# Patient Record
Sex: Female | Born: 1950 | Race: Black or African American | Hispanic: No | Marital: Single | Smoking: Never smoker
Health system: Southern US, Community
[De-identification: ages and names within clinical notes are randomized; demographics above are authoritative.]

## PROBLEM LIST (undated history)

## (undated) DIAGNOSIS — M549 Dorsalgia, unspecified: Secondary | ICD-10-CM

## (undated) DIAGNOSIS — M359 Systemic involvement of connective tissue, unspecified: Secondary | ICD-10-CM

## (undated) DIAGNOSIS — D649 Anemia, unspecified: Secondary | ICD-10-CM

## (undated) DIAGNOSIS — R51 Headache: Secondary | ICD-10-CM

## (undated) DIAGNOSIS — K829 Disease of gallbladder, unspecified: Secondary | ICD-10-CM

## (undated) DIAGNOSIS — M2550 Pain in unspecified joint: Secondary | ICD-10-CM

## (undated) DIAGNOSIS — E669 Obesity, unspecified: Secondary | ICD-10-CM

## (undated) DIAGNOSIS — E559 Vitamin D deficiency, unspecified: Secondary | ICD-10-CM

## (undated) DIAGNOSIS — F329 Major depressive disorder, single episode, unspecified: Secondary | ICD-10-CM

## (undated) DIAGNOSIS — M3500 Sicca syndrome, unspecified: Secondary | ICD-10-CM

## (undated) DIAGNOSIS — G4733 Obstructive sleep apnea (adult) (pediatric): Secondary | ICD-10-CM

## (undated) DIAGNOSIS — R9431 Abnormal electrocardiogram [ECG] [EKG]: Secondary | ICD-10-CM

## (undated) DIAGNOSIS — M1990 Unspecified osteoarthritis, unspecified site: Secondary | ICD-10-CM

## (undated) DIAGNOSIS — R519 Headache, unspecified: Secondary | ICD-10-CM

## (undated) DIAGNOSIS — D8989 Other specified disorders involving the immune mechanism, not elsewhere classified: Secondary | ICD-10-CM

## (undated) DIAGNOSIS — IMO0001 Reserved for inherently not codable concepts without codable children: Secondary | ICD-10-CM

## (undated) DIAGNOSIS — I10 Essential (primary) hypertension: Secondary | ICD-10-CM

## (undated) DIAGNOSIS — F32A Depression, unspecified: Secondary | ICD-10-CM

## (undated) HISTORY — DX: Pain in unspecified joint: M25.50

## (undated) HISTORY — DX: Anemia, unspecified: D64.9

## (undated) HISTORY — DX: Major depressive disorder, single episode, unspecified: F32.9

## (undated) HISTORY — DX: Depression, unspecified: F32.A

## (undated) HISTORY — DX: Dorsalgia, unspecified: M54.9

## (undated) HISTORY — PX: ABDOMINAL HYSTERECTOMY: SHX81

## (undated) HISTORY — DX: Obstructive sleep apnea (adult) (pediatric): G47.33

## (undated) HISTORY — DX: Unspecified osteoarthritis, unspecified site: M19.90

## (undated) HISTORY — DX: Vitamin D deficiency, unspecified: E55.9

## (undated) HISTORY — DX: Sicca syndrome, unspecified: M35.00

## (undated) HISTORY — DX: Systemic involvement of connective tissue, unspecified: M35.9

## (undated) HISTORY — PX: CHOLECYSTECTOMY: SHX55

## (undated) HISTORY — DX: Disease of gallbladder, unspecified: K82.9

## (undated) HISTORY — PX: OTHER SURGICAL HISTORY: SHX169

## (undated) HISTORY — DX: Essential (primary) hypertension: I10

## (undated) HISTORY — DX: Obesity, unspecified: E66.9

## (undated) HISTORY — DX: Abnormal electrocardiogram (ECG) (EKG): R94.31

---

## 1998-02-06 ENCOUNTER — Encounter: Admission: RE | Admit: 1998-02-06 | Discharge: 1998-05-07 | Payer: Self-pay | Admitting: Family Medicine

## 2000-02-02 ENCOUNTER — Encounter: Admission: RE | Admit: 2000-02-02 | Discharge: 2000-02-02 | Payer: Self-pay | Admitting: *Deleted

## 2000-02-02 ENCOUNTER — Encounter: Payer: Self-pay | Admitting: *Deleted

## 2001-08-16 ENCOUNTER — Encounter: Payer: Self-pay | Admitting: Family Medicine

## 2001-08-16 ENCOUNTER — Encounter: Admission: RE | Admit: 2001-08-16 | Discharge: 2001-08-16 | Payer: Self-pay | Admitting: Family Medicine

## 2002-05-07 ENCOUNTER — Encounter: Payer: Self-pay | Admitting: Family Medicine

## 2002-05-07 ENCOUNTER — Encounter: Admission: RE | Admit: 2002-05-07 | Discharge: 2002-05-07 | Payer: Self-pay | Admitting: Family Medicine

## 2004-12-28 ENCOUNTER — Encounter: Admission: RE | Admit: 2004-12-28 | Discharge: 2005-03-28 | Payer: Self-pay | Admitting: Family Medicine

## 2006-03-23 ENCOUNTER — Encounter: Admission: RE | Admit: 2006-03-23 | Discharge: 2006-03-23 | Payer: Self-pay | Admitting: Family Medicine

## 2006-06-27 ENCOUNTER — Emergency Department (HOSPITAL_COMMUNITY): Admission: EM | Admit: 2006-06-27 | Discharge: 2006-06-27 | Payer: Self-pay | Admitting: Emergency Medicine

## 2008-08-19 ENCOUNTER — Emergency Department (HOSPITAL_COMMUNITY): Admission: EM | Admit: 2008-08-19 | Discharge: 2008-08-20 | Payer: Self-pay | Admitting: Emergency Medicine

## 2010-02-19 ENCOUNTER — Ambulatory Visit (HOSPITAL_COMMUNITY)
Admission: RE | Admit: 2010-02-19 | Discharge: 2010-02-19 | Payer: Self-pay | Source: Home / Self Care | Admitting: Family Medicine

## 2010-06-28 LAB — URINALYSIS, ROUTINE W REFLEX MICROSCOPIC
Glucose, UA: NEGATIVE mg/dL
Ketones, ur: NEGATIVE mg/dL
Protein, ur: NEGATIVE mg/dL
Urobilinogen, UA: 1 mg/dL (ref 0.0–1.0)

## 2010-06-28 LAB — POCT CARDIAC MARKERS
CKMB, poc: 1.4 ng/mL (ref 1.0–8.0)
Myoglobin, poc: 139 ng/mL (ref 12–200)
Troponin i, poc: <0.05 ng/mL (ref 0.00–0.09)

## 2010-06-28 LAB — CBC
Platelets: 211 10*3/uL (ref 150–400)
RBC: 4.57 MIL/uL (ref 3.87–5.11)
WBC: 9.4 10*3/uL (ref 4.0–10.5)

## 2010-06-28 LAB — POCT I-STAT 3, VENOUS BLOOD GAS (G3P V)
Patient temperature: 98.1
pH, Ven: 7.446 — ABNORMAL HIGH (ref 7.250–7.300)

## 2010-06-28 LAB — DIFFERENTIAL
Lymphocytes Relative: 16 % (ref 12–46)
Lymphs Abs: 1.5 10*3/uL (ref 0.7–4.0)
Neutrophils Relative %: 73 % (ref 43–77)

## 2010-06-28 LAB — BASIC METABOLIC PANEL
BUN: 11 mg/dL (ref 6–23)
Creatinine, Ser: 0.83 mg/dL (ref 0.4–1.2)
GFR calc Af Amer: >60 mL/min (ref >60)
GFR calc non Af Amer: >60 mL/min (ref >60)
Potassium: 3.8 mEq/L (ref 3.5–5.1)

## 2010-06-28 LAB — BRAIN NATRIURETIC PEPTIDE: Pro B Natriuretic peptide (BNP): <30 pg/mL (ref 0.0–100.0)

## 2011-01-07 ENCOUNTER — Ambulatory Visit
Admission: RE | Admit: 2011-01-07 | Discharge: 2011-01-07 | Disposition: A | Discharge status: Home / Self Care | Payer: Federal, State, Local not specified - PPO | Source: Ambulatory Visit | Attending: Family Medicine | Admitting: Family Medicine

## 2011-01-07 ENCOUNTER — Other Ambulatory Visit: Payer: Self-pay | Admitting: Family Medicine

## 2011-03-22 DIAGNOSIS — M3500 Sicca syndrome, unspecified: Secondary | ICD-10-CM

## 2011-03-22 HISTORY — DX: Sjogren syndrome, unspecified: M35.00

## 2011-05-27 ENCOUNTER — Ambulatory Visit (INDEPENDENT_AMBULATORY_CARE_PROVIDER_SITE_OTHER): Payer: Federal, State, Local not specified - PPO | Admitting: Family Medicine

## 2011-05-27 VITALS — BP 204/76 | HR 50 | Temp 98.8°F | Resp 18 | Ht 63.5 in | Wt 259.0 lb

## 2011-05-27 DIAGNOSIS — I10 Essential (primary) hypertension: Secondary | ICD-10-CM

## 2011-05-27 MED ORDER — OLMESARTAN MEDOXOMIL 20 MG PO TABS: 20.0000 mg | ORAL_TABLET | Freq: Every day | ORAL | Status: DC

## 2011-05-27 NOTE — Progress Notes (Signed)
61 yo woman here for hypertension review.  Metoprolol seems to have caused visual problems, hair loss, intermittent chest pressure, slow heart rate, cold feelings, and headaches.  Also, the systolic BP is often elevated, although the diastolic is 70 to 80.    O:  NAD Pulse 50  BP 170/90 Heart:  Reg, no murmur Ext: no edema  A:  Adverse effects from metoprolol  P:  Taper metoprolol by taking 1/2 tab daily for 2 weeks, then discontinuing Benicar 20 mg daily

## 2011-05-27 NOTE — Patient Instructions (Signed)

## 2011-07-27 ENCOUNTER — Ambulatory Visit (INDEPENDENT_AMBULATORY_CARE_PROVIDER_SITE_OTHER): Payer: Federal, State, Local not specified - PPO | Admitting: Family Medicine

## 2011-07-27 ENCOUNTER — Encounter: Payer: Self-pay | Admitting: Family Medicine

## 2011-07-27 DIAGNOSIS — R079 Chest pain, unspecified: Secondary | ICD-10-CM

## 2011-07-27 DIAGNOSIS — Z0000 Encounter for general adult medical examination without abnormal findings: Secondary | ICD-10-CM

## 2011-07-27 DIAGNOSIS — I10 Essential (primary) hypertension: Secondary | ICD-10-CM

## 2011-07-27 NOTE — Progress Notes (Signed)
Patient is having intermittent vague chest pains x 2 weeks, not related to exercise.  Her blood pressure has been elevated during this time despite making the transition from Metoprolol.  She has a family hx of cardiac disease (father had CABG in his late 57's)  O:  NAD, obese woman HEENT:  Unremarkable Chest:  Clear Heart:  Reg without murmur or gallop Ext:  No edema EKG:  ST changes c/w ischemia or strain  A:  I am concerned with the EKG changes.  The blood pressure is also not where we want it  P:  Schedule cardiology appt ASAP and add Norvasc 5 mg qd to the Benicar 20 mg. Recheck in a month

## 2011-08-03 ENCOUNTER — Other Ambulatory Visit: Payer: Self-pay | Admitting: Family Medicine

## 2011-08-03 DIAGNOSIS — Z1231 Encounter for screening mammogram for malignant neoplasm of breast: Secondary | ICD-10-CM

## 2011-08-18 ENCOUNTER — Encounter: Payer: Self-pay | Admitting: Family Medicine

## 2011-08-18 ENCOUNTER — Ambulatory Visit (INDEPENDENT_AMBULATORY_CARE_PROVIDER_SITE_OTHER): Payer: Federal, State, Local not specified - PPO | Admitting: Family Medicine

## 2011-08-18 VITALS — BP 145/80 | HR 62 | Temp 98.4°F | Resp 16 | Ht 63.5 in | Wt 270.8 lb

## 2011-08-18 DIAGNOSIS — I10 Essential (primary) hypertension: Secondary | ICD-10-CM

## 2011-08-18 MED ORDER — AMLODIPINE BESYLATE 10 MG PO TABS: 10.0000 mg | ORAL_TABLET | Freq: Every day | ORAL | Status: DC

## 2011-08-18 MED ORDER — OLMESARTAN MEDOXOMIL 20 MG PO TABS: 20.0000 mg | ORAL_TABLET | Freq: Every day | ORAL | Status: DC

## 2011-08-18 NOTE — Progress Notes (Signed)
61 yo woman with hypertension and intermittent chest pain.  She has seen Dr. Jacinto Halim who has prescribed a higher dose of amlodipine and omeprazole and will see her for GXT and echo in next 10 days.  She has sleep apnea appointment next week.  No significant chest pains lately, so this chest pain does seem to be improving. Has colonoscopy scheduled for June 4, mammogram also scheduled for June.  Patient is getting 115/70 now at home.  O: NAD, obese woman Chest:  Clear Heart:  I/VI SEM, regular Ext:  No edema Neck supple, 1/2 cm left posterior cervical sub q nodule, nontender, nonfluctuant  A: stable htn  P: okay to refill meds  Follow up 4 months

## 2011-08-18 NOTE — Patient Instructions (Signed)

## 2011-08-30 ENCOUNTER — Ambulatory Visit
Admission: RE | Admit: 2011-08-30 | Discharge: 2011-08-30 | Disposition: A | Discharge status: Home / Self Care | Payer: Federal, State, Local not specified - PPO | Source: Ambulatory Visit | Attending: Family Medicine | Admitting: Family Medicine

## 2011-08-30 DIAGNOSIS — Z1231 Encounter for screening mammogram for malignant neoplasm of breast: Secondary | ICD-10-CM

## 2011-09-27 ENCOUNTER — Other Ambulatory Visit: Payer: Self-pay | Admitting: Family Medicine

## 2011-11-25 ENCOUNTER — Ambulatory Visit: Payer: Federal, State, Local not specified - PPO

## 2011-11-25 ENCOUNTER — Ambulatory Visit (INDEPENDENT_AMBULATORY_CARE_PROVIDER_SITE_OTHER): Payer: Federal, State, Local not specified - PPO | Admitting: Family Medicine

## 2011-11-25 VITALS — BP 150/72 | HR 61 | Temp 97.9°F | Resp 16 | Ht 64.5 in | Wt 275.0 lb

## 2011-11-25 DIAGNOSIS — M79643 Pain in unspecified hand: Secondary | ICD-10-CM

## 2011-11-25 DIAGNOSIS — M79609 Pain in unspecified limb: Secondary | ICD-10-CM

## 2011-11-25 DIAGNOSIS — M1990 Unspecified osteoarthritis, unspecified site: Secondary | ICD-10-CM

## 2011-11-25 DIAGNOSIS — M129 Arthropathy, unspecified: Secondary | ICD-10-CM

## 2011-11-25 MED ORDER — TRAMADOL HCL 50 MG PO TABS: 50.0000 mg | ORAL_TABLET | Freq: Three times a day (TID) | ORAL | Status: AC | PRN

## 2011-11-25 NOTE — Progress Notes (Signed)
[facility] [address] [place] [address] [phone]  Date:  11/25/2011   Name:  Elizabeth [name]   DOB:  [birth year]   MRN:  [id number]  PCP:  No primary provider on file.    Chief Complaint: Hand Pain   History of Present Illness:  Elizabeth [name] is a 61 y.o. very pleasant female patient who presents with the following:  History of HTN and obesity.  Here today to discuss hand pain. 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   She has noted the return of her hand pain.  She hurts in both hands, in the palm and fingers. Not able to specify which fingers bother her, but the 5th fingers seem to be ok. Right hand hurts the most.  She has not noted worse pain with any particular time of day or activity.  She notes tingling in both hands, and that her fingers hurt as well.  She notes increased difficulty holding/ picking up things.  It hurts if someone shakes her hand.  She has not noted any particular morning stiffness.   No other joint pains. She has worn her old wrist braces some, but they have not really helped.   She has not used much motrin because she just had a colonoscopy she was told not to use her ibuprofen around the time of her test.  The test was normal, and it was done last week.   She did not take her HTN medications yet today- she usually takes it first thing in the morning.    No family or personal history of RA or autoimmune disorder that she knows of  Patient Active Problem List  Diagnosis  . Hypertension    No past medical history on file.  No past surgical history on file.  History  Substance Use Topics  . Smoking status: Never Smoker   . Smokeless tobacco: Not on file  . Alcohol Use: Not on file    No family history on file.  No Known Allergies  Medication list has been reviewed and updated.  Current Outpatient Prescriptions on File Prior to Visit  Medication Sig Dispense Refill  .  amLODipine (NORVASC) 10 MG tablet Take 1 tablet (10 mg total) by mouth daily.  90 tablet  3  . olmesartan (BENICAR) 20 MG tablet Take 1 tablet (20 mg total) by mouth daily.  90 tablet  3  . aspirin 81 MG chewable tablet Chew 81 mg by mouth daily.      . fish oil-omega-3 fatty acids 1000 MG capsule Take 2 g by mouth daily.      Marland Kitchen omeprazole (PRILOSEC) 40 MG capsule Take 40 mg by mouth daily.        Review of Systems:  As per HPI- otherwise negative.   Physical Examination: Filed Vitals:   11/25/11 0840  BP: 150/72  Pulse: 61  Temp: 97.9 F (36.6 C)  Resp: 16   Filed Vitals:   11/25/11 0840  Height: 5' 4.5" (1.638 m)  Weight: 275 lb (124.739 kg)   Body mass index is 46.47 kg/(m^2). Ideal Body Weight: Weight in (lb) to have BMI = 25: 147.6   GEN: WDWN, NAD, Non-toxic, A & O x 3, obese HEENT: Atraumatic, Normocephalic. Neck supple. No masses, No LAD.   Ears and Nose: No external deformity. CV: RRR, No M/G/R. No JVD. No thrill. No extra heart sounds. PULM: CTA B, no wheezes,  crackles, rhonchi. No retractions. No resp. distress. No accessory muscle use. EXTR: No c/c/e NEURO Normal gait.  PSYCH: Normally interactive. Conversant. Not depressed or anxious appearing.  Calm demeanor.  Hands: no swelling or point tenderness. No nodules or ulnar deviation. Some tingling with percussion over the median nerve right hand.  She has 4/5 strength on grip testing.  Normal wrist flexion and extension strength.    [facility] reading (PRIMARY) by  Dr. Patsy Lager.  Hands: minor IP joint changes, no erosions or fracture/ dislocation  LEFT HAND - 2 VIEW  Comparison: None.  Findings: There are slight degenerative changes of the first metacarpal phalangeal joint between the scaphoid and the trapezium and trapezoid. Degenerative arthritis of the first metacarpal phalangeal joint. The other metacarpal phalangeal joints are normal. Minimal spurring at the DIP joints of the fingers.  No periarticular  demineralization or subluxation.  IMPRESSION: Scattered areas of slight osteoarthritis.  RIGHT HAND - 2 VIEW  Comparison: None.  Findings: There is slight arthritic changes of the first metacarpal phalangeal joint and of the first carpal metacarpal joint. There are slight osteophytes at the second third fourth metacarpal phalangeal joints as well as a small erosions at the DIP joints of all of the fingers.  No periarticular demineralization or subluxation.  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  Assessment and Plan: 1. Hand pain  DG Hand 2 View Left, DG Hand 2 View Right  2. Arthritis  traMADol (ULTRAM) 50 MG tablet   See pt instructions.  Reminded to take her BP medications regularly.  Will not use ibuprofen 800 as she does have HTN now.    COPLAND,JESSICA, MD addnd 11/28/11- called her to discuss erosions noted as above.  We need to evaluate her further for possible RA.  Will order RF, anti- CCP, ESR, CRP, ANA and CBC. She will come by to have these labs drawn in the next week.

## 2011-11-25 NOTE — Patient Instructions (Addendum)
Wear wrist splints at night.  Take ultram for more severe pain- otherwise tylenol and /or ibuprofen as needed.  Give me a call if not a lot better within a couple of weeks

## 2011-11-29 ENCOUNTER — Other Ambulatory Visit: Payer: Federal, State, Local not specified - PPO | Admitting: Family Medicine

## 2011-11-29 DIAGNOSIS — M1990 Unspecified osteoarthritis, unspecified site: Secondary | ICD-10-CM

## 2011-11-29 LAB — POCT CBC
Granulocyte percent: 64.2 %G (ref 37–80)
MCV: 82.7 fL (ref 80–97)
MID (cbc): 0.3 (ref 0–0.9)
MPV: 9.4 fL (ref 0–99.8)
Platelet Count, POC: 186 10*3/uL (ref 142–424)
RBC: 4.21 M/uL (ref 4.04–5.48)

## 2011-11-30 LAB — ANA: Anti Nuclear Antibody(ANA): POSITIVE — AB

## 2011-11-30 LAB — ANTI-NUCLEAR AB-TITER (ANA TITER)

## 2011-11-30 LAB — CYCLIC CITRUL PEPTIDE ANTIBODY, IGG: Cyclic Citrullin Peptide Ab: <2 U/mL (ref 0.0–5.0)

## 2011-11-30 LAB — RHEUMATOID FACTOR: Rhuematoid fact SerPl-aCnc: <10 IU/mL (ref <=14)

## 2011-12-02 ENCOUNTER — Telehealth: Payer: Self-pay | Admitting: Family Medicine

## 2011-12-02 DIAGNOSIS — M79643 Pain in unspecified hand: Secondary | ICD-10-CM

## 2011-12-02 NOTE — Telephone Encounter (Signed)
Called and spoke with her- I do not think that she has RA, but there may be another auto- immune problem present.  Her ANA is very positive.  I will refer her to rheumatology asap.

## 2011-12-16 ENCOUNTER — Encounter: Payer: Self-pay | Admitting: Family Medicine

## 2011-12-16 DIAGNOSIS — R9431 Abnormal electrocardiogram [ECG] [EKG]: Secondary | ICD-10-CM | POA: Insufficient documentation

## 2011-12-20 LAB — HM DEXA SCAN

## 2011-12-29 ENCOUNTER — Encounter: Payer: Self-pay | Admitting: Family Medicine

## 2011-12-29 ENCOUNTER — Ambulatory Visit (INDEPENDENT_AMBULATORY_CARE_PROVIDER_SITE_OTHER): Payer: Federal, State, Local not specified - PPO | Admitting: Family Medicine

## 2011-12-29 VITALS — BP 164/90 | HR 76 | Temp 99.8°F | Resp 18 | Ht 63.75 in | Wt 277.0 lb

## 2011-12-29 DIAGNOSIS — H6980 Other specified disorders of Eustachian tube, unspecified ear: Secondary | ICD-10-CM

## 2011-12-29 DIAGNOSIS — J029 Acute pharyngitis, unspecified: Secondary | ICD-10-CM

## 2011-12-29 NOTE — Progress Notes (Signed)
  Subjective:    Patient ID: Elizabeth [name], female    DOB: Apr 26, 1950, 61 y.o.   MRN: [id number]  HPI Elizabeth [name] is a 61 y.o. female Sore throat, raw for past week. Headache, and occasional low grade fever in the evening in the 99's  - usually in 98 range. .  Pressure in r ear over a week., not pain, just pressure. No known sick contacts.   Tx: otc cough drops.   No chest congestion or lung sx's   Retired from [employer].  Review of Systems  Constitutional: Negative for fever and chills.  HENT: Positive for ear pain (pressure in R ear. trouble clearing this. ), congestion and sore throat.   Respiratory: Negative for cough and shortness of breath.         Objective:   Physical Exam  Constitutional: She is oriented to person, place, and time. She appears well-developed and well-nourished. No distress.  HENT:  Head: Normocephalic and atraumatic.  Right Ear: Hearing, external ear and ear canal normal.  Left Ear: Hearing, tympanic membrane, external ear and ear canal normal.  Nose: Nose normal.  Mouth/Throat: Mucous membranes are normal. Posterior oropharyngeal erythema present. No oropharyngeal exudate, posterior oropharyngeal edema or tonsillar abscesses.       Dull tm on right with small nonobstructive cerumen. No erythema or effusion noted.   Eyes: Conjunctivae normal and EOM are normal. Pupils are equal, round, and reactive to light.  Neck: No thyromegaly present.  Cardiovascular: Normal rate, regular rhythm, normal heart sounds and intact distal pulses.   No murmur heard. Pulmonary/Chest: Effort normal and breath sounds normal. No respiratory distress. She has no wheezes. She has no rhonchi.  Lymphadenopathy:    She has no cervical adenopathy.  Neurological: She is alert and oriented to person, place, and time.  Skin: Skin is warm and dry. No rash noted.  Psychiatric: She has a normal mood and affect. Her behavior is normal.   Results for orders placed in visit on 12/29/11    POCT RAPID STREP A (OFFICE)      Component Value Range   Rapid Strep A Screen Negative  Negative       Assessment & Plan:  Elizabeth [name] is a 61 y.o. female  1. Sorethroat  POCT rapid strep A  2. ETD (eustachian tube dysfunction)     Likely viral pharyngitis.  May have allergic component with etd.  See below.  Trial of claritin otc and rtc precautions.

## 2011-12-29 NOTE — Patient Instructions (Addendum)
You can try over the counter claritin and saline nasal spray during day for the blocked ear (eustachian tube dysfunction), and lozenges, tylenol or ibuprofen for your sore throat. See other info below.  Return to the clinic or go to the nearest emergency room if any of your symptoms worsen or new symptoms occur. Viral Pharyngitis Viral pharyngitis is a viral infection that produces redness, pain, and swelling (inflammation) of the throat. It can spread from person to person (contagious). CAUSES Viral pharyngitis is caused by inhaling a large amount of certain germs called viruses. Many different viruses cause viral pharyngitis. SYMPTOMS Symptoms of viral pharyngitis include:  Sore throat.  Tiredness.  Stuffy nose.  Low-grade fever.  Congestion.  Cough. TREATMENT Treatment includes rest, drinking plenty of fluids, and the use of over-the-counter medication (approved by your caregiver). HOME CARE INSTRUCTIONS   Drink enough fluids to keep your urine clear or pale yellow.  Eat soft, cold foods such as ice cream, frozen ice pops, or gelatin dessert.  Gargle with warm salt water (1 tsp salt per 1 qt of water).  If over age 36, throat lozenges may be used safely.  Only take over-the-counter or prescription medicines for pain, discomfort, or fever as directed by your caregiver. Do not take aspirin. To help prevent spreading viral pharyngitis to others, avoid:  Mouth-to-mouth contact with others.  Sharing utensils for eating and drinking.  Coughing around others. SEEK MEDICAL CARE IF:   You are better in a few days, then become worse.  You have a fever or pain not helped by pain medicines.  There are any other changes that concern you. Document Released: 12/15/2004 Document Revised: 05/30/2011 Document Reviewed: 05/13/2010 [facility] Patient Information 2013 [place], [place].

## 2012-01-12 ENCOUNTER — Encounter: Payer: Self-pay | Admitting: Family Medicine

## 2012-01-12 ENCOUNTER — Ambulatory Visit (INDEPENDENT_AMBULATORY_CARE_PROVIDER_SITE_OTHER): Payer: Federal, State, Local not specified - PPO | Admitting: Family Medicine

## 2012-01-12 VITALS — BP 147/79 | HR 74 | Temp 98.7°F | Resp 16 | Ht 64.0 in | Wt 275.0 lb

## 2012-01-12 DIAGNOSIS — M1990 Unspecified osteoarthritis, unspecified site: Secondary | ICD-10-CM

## 2012-01-12 DIAGNOSIS — R768 Other specified abnormal immunological findings in serum: Secondary | ICD-10-CM

## 2012-01-12 DIAGNOSIS — M129 Arthropathy, unspecified: Secondary | ICD-10-CM

## 2012-01-12 NOTE — Patient Instructions (Signed)
61 yo woman with hand pains for past 2-3 months.  No F/H lupus  O: tender, puffy hands  Clinical Data: Bilateral hand pain.  RIGHT HAND - 2 VIEW  Comparison: None.  Findings: There is slight arthritic changes of the first metacarpal  phalangeal joint and of the first carpal metacarpal joint. There  are slight osteophytes at the second third fourth metacarpal  phalangeal joints as well as a small erosions at the DIP joints of  all of the fingers.  No periarticular demineralization or subluxation.  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  Clinically significant discrepancy from primary report, if  provided: None     Value  Range   POCT SED RATE  76 (A)  0 - 22 mm/hr   Resulting Agency  [facility]      Specimen Collected: 11/29/11 11:45 AM  Last Resulted: 11/29/11 11:45 AM                Other Results from 11/29/2011            POCT CBC Status: Final result MyChart: Not Released Next appt with me: None Dx: Arthritis         Range  68mo ago  26yr ago     WBC  4.6 - 10.2 K/uL  3.4 (A)   9.4R      Lymph, poc  0.6 - 3.4  0.9        POC LYMPH PERCENT  10 - 50 %L  27.5        MID (cbc)  0 - 0.9  0.3        POC MID %  0 - 12 %M  8.3        POC Granulocyte  2 - 6.9  2.2        Granulocyte percent  37 - 80 %G  64.2        RBC  4.04 - 5.48 M/uL  4.21   4.57R      Hemoglobin  12.2 - 16.2 g/dL  04.5 (A)   40.9W      HCT, POC  37.7 - 47.9 %  34.8 (A)   36.7R      MCV  80 - 97 fL  82.7   80.1R      MCH, POC  27 - 31.2 pg  24.7 (A)        MCHC  31.8 - 35.4 g/dL  11.9 (A)   14.7W      RDW, POC  %  17.0        Platelet Count, POC  142 - 424 K/uL  186        MPV  0 - 99.8 fL  9.4       Resulting Agency   [facility]     Specimen Collected: 11/29/11 10:49 AM  Last Resulted: 11/29/11 10:49 AM            R=Reference range differs from displayed range       C-reactive protein Status: Edited MyChart: Not Released Next appt with me: None Dx:  Arthritis       Notes Recorded by Pearline Cables, MD on 12/02/2011 at 3:28 PM Reached her today and discussed. Referral to rheum done Notes Recorded by Pearline Cables, MD on 12/01/2011 at 5:03 PM Called and LM on call- will try her back later. She needs to see rheum Notes Recorded by Pearline Cables, MD on 11/30/2011 at  10:26 AM Await other final labs         Value  Range    CRP  1.2 (H)  <0.60 mg/dL    Resulting Agency  [facility]      Result Narrative      Performed at: [facility] [address], [address] [place], [place] [address]        Specimen Collected: 11/29/11 10:42 AM  Last Resulted: 11/30/11 12:51 AM              Cyclic citrul peptide antibody, IgG Status: Edited MyChart: Not Released Next appt with me: None Dx: Arthritis       Notes Recorded by Pearline Cables, MD on 12/02/2011 at 3:28 PM Reached her today and discussed. Referral to rheum done Notes Recorded by Pearline Cables, MD on 12/01/2011 at 5:03 PM Called and LM on call- will try her back later. She needs to see rheum Notes Recorded by Pearline Cables, MD on 11/30/2011 at 10:26 AM Await other final labs         Value  Range    Cyclic Citrullin Peptide Ab  <2.0  0.0 - 5.0 U/mL    Comments:     Interpretive Table Low Positive: 5.1 - 14.9 IU/mL High Positive: >= 15.0 IU/mL  In addition to the CCP ([facility]) result, and clinical symptoms including joint involvement, the 2010 ACR Classification Criteria for scoring/diagnosing Rheumatoid Arthritis include the results of the following tests: RF (23890), CRP (54098), and ESR (15010). www.rheumatology.org/practice/clinical/classification/ra/ra_2010.asp    Resulting Agency  [facility]      Result Narrative      Performed at: [facility] [address] [place], [place] [address]        Specimen Collected: 11/29/11 10:42 AM  Last Resulted: 11/30/11 2:07 PM              Rheumatoid factor Status: Edited MyChart: Not  Released Next appt with me: None Dx: Arthritis       Notes Recorded by Pearline Cables, MD on 12/02/2011 at 3:28 PM Reached her today and discussed. Referral to rheum done Notes Recorded by Pearline Cables, MD on 12/01/2011 at 5:03 PM Called and LM on call- will try her back later. She needs to see rheum Notes Recorded by Pearline Cables, MD on 11/30/2011 at 10:26 AM Await other final labs         Value  Range    Rheumatoid Factor  <10  <=14 IU/mL    Comments:     Interpretive Table Low Positive: 15 - 41 IU/mL High Positive: >= 42 IU/mL  In addition to the RF result, and clinical symptoms including joint involvement, the 2010 ACR Classification Criteria for scoring/diagnosing Rheumatoid Arthritis include the results of the following tests: CRP (95621), ESR (15010), and CCP ([facility]) (30865). www.rheumatology.org/practice/clinical/classification/ra/ra_2010.asp    Resulting Agency  [facility]      Result Narrative      Performed at: [facility] [address] [place], [place] [address]        Specimen Collected: 11/29/11 10:42 AM  Last Resulted: 11/30/11 12:51 AM              ANA Status: Final result MyChart: Not Released Next appt with me: None Dx: Arthritis       Notes Recorded by Pearline Cables, MD on 12/02/2011 at 3:28 PM Reached her today and discussed. Referral to rheum done Notes Recorded by Pearline Cables, MD  on 12/01/2011 at 5:03 PM Called and LM on call- will try her back later. She needs to see rheum Notes Recorded by Pearline Cables, MD on 11/30/2011 at 10:26 AM Await other final labs         Value  Range    ANA  POS (A)  NEGATIVE     Resulting Agency  [facility]      Result Narrative      Performed at: [facility] [address] [place], [place] [address]        Specimen Collected: 11/29/11 10:42 AM  Last Resulted: 11/30/11 2:32 PM              Anti-nuclear ab-titer (ANA titer) Status: Final result  MyChart: Not Released Next appt with me: None       Notes Recorded by Pearline Cables, MD on 12/02/2011 at 3:28 PM Reached her today and discussed. Referral to rheum done Notes Recorded by Pearline Cables, MD on 12/01/2011 at 5:03 PM Called and LM on call- will try her back later. She needs to see rheum        Value  Range    ANA Titer 1  1:640 (H)  <1:40     Comments:     Reference Ranges: 1:40 - 1:80 Weakly positive, usually not clinically significant. > or = to 1:160 Result may be clinically significant.     ANA Pattern 1  SPECKLED (A)      Resulting Agency  [facility]      Result Narrative      Performed at: [facility] [address] [place], [place] [address]

## 2012-01-12 NOTE — Progress Notes (Signed)
61 yo woman with hand pains for past 2-3 months.  No F/H lupus  O: tender, puffy hands  Clinical Data: Bilateral hand pain.  RIGHT HAND - 2 VIEW  Comparison: None.  Findings: There is slight arthritic changes of the first metacarpal  phalangeal joint and of the first carpal metacarpal joint. There  are slight osteophytes at the second third fourth metacarpal  phalangeal joints as well as a small erosions at the DIP joints of  all of the fingers.  No periarticular demineralization or subluxation.  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  Clinically significant discrepancy from primary report, if  provided: None     Value  Range   POCT SED RATE  76 (A)  0 - 22 mm/hr   Resulting Agency  [facility]      Specimen Collected: 11/29/11 11:45 AM  Last Resulted: 11/29/11 11:45 AM                Other Results from 11/29/2011            POCT CBC Status: Final result MyChart: Not Released Next appt with me: None Dx: Arthritis         Range  25mo ago  68yr ago     WBC  4.6 - 10.2 K/uL  3.4 (A)   9.4R      Lymph, poc  0.6 - 3.4  0.9        POC LYMPH PERCENT  10 - 50 %L  27.5        MID (cbc)  0 - 0.9  0.3        POC MID %  0 - 12 %M  8.3        POC Granulocyte  2 - 6.9  2.2        Granulocyte percent  37 - 80 %G  64.2        RBC  4.04 - 5.48 M/uL  4.21   4.57R      Hemoglobin  12.2 - 16.2 g/dL  96.0 (A)   45.4U      HCT, POC  37.7 - 47.9 %  34.8 (A)   36.7R      MCV  80 - 97 fL  82.7   80.1R      MCH, POC  27 - 31.2 pg  24.7 (A)        MCHC  31.8 - 35.4 g/dL  98.1 (A)   19.1Y      RDW, POC  %  17.0        Platelet Count, POC  142 - 424 K/uL  186        MPV  0 - 99.8 fL  9.4       Resulting Agency   [facility]     Specimen Collected: 11/29/11 10:49 AM  Last Resulted: 11/29/11 10:49 AM            R=Reference range differs from displayed range       C-reactive protein Status: Edited MyChart: Not Released Next appt with me: None Dx:  Arthritis       Notes Recorded by Pearline Cables, MD on 12/02/2011 at 3:28 PM Reached her today and discussed. Referral to rheum done Notes Recorded by Pearline Cables, MD on 12/01/2011 at 5:03 PM Called and LM on call- will try her back later. She needs to see rheum Notes Recorded by Pearline Cables, MD on 11/30/2011 at  10:26 AM Await other final labs         Value  Range    CRP  1.2 (H)  <0.60 mg/dL    Resulting Agency  [facility]      Result Narrative      Performed at: [facility] [address], [address] [place], [place] [address]        Specimen Collected: 11/29/11 10:42 AM  Last Resulted: 11/30/11 12:51 AM              Cyclic citrul peptide antibody, IgG Status: Edited MyChart: Not Released Next appt with me: None Dx: Arthritis       Notes Recorded by Pearline Cables, MD on 12/02/2011 at 3:28 PM Reached her today and discussed. Referral to rheum done Notes Recorded by Pearline Cables, MD on 12/01/2011 at 5:03 PM Called and LM on call- will try her back later. She needs to see rheum Notes Recorded by Pearline Cables, MD on 11/30/2011 at 10:26 AM Await other final labs         Value  Range    Cyclic Citrullin Peptide Ab  <2.0  0.0 - 5.0 U/mL    Comments:     Interpretive Table Low Positive: 5.1 - 14.9 IU/mL High Positive: >= 15.0 IU/mL  In addition to the CCP ([facility]) result, and clinical symptoms including joint involvement, the 2010 ACR Classification Criteria for scoring/diagnosing Rheumatoid Arthritis include the results of the following tests: RF (23890), CRP (54098), and ESR (15010). www.rheumatology.org/practice/clinical/classification/ra/ra_2010.asp    Resulting Agency  [facility]      Result Narrative      Performed at: [facility] [address] [place], [place] [address]        Specimen Collected: 11/29/11 10:42 AM  Last Resulted: 11/30/11 2:07 PM              Rheumatoid factor Status: Edited MyChart: Not  Released Next appt with me: None Dx: Arthritis       Notes Recorded by Pearline Cables, MD on 12/02/2011 at 3:28 PM Reached her today and discussed. Referral to rheum done Notes Recorded by Pearline Cables, MD on 12/01/2011 at 5:03 PM Called and LM on call- will try her back later. She needs to see rheum Notes Recorded by Pearline Cables, MD on 11/30/2011 at 10:26 AM Await other final labs         Value  Range    Rheumatoid Factor  <10  <=14 IU/mL    Comments:     Interpretive Table Low Positive: 15 - 41 IU/mL High Positive: >= 42 IU/mL  In addition to the RF result, and clinical symptoms including joint involvement, the 2010 ACR Classification Criteria for scoring/diagnosing Rheumatoid Arthritis include the results of the following tests: CRP (95621), ESR (15010), and CCP (APCA) (30865). www.rheumatology.org/practice/clinical/classification/ra/ra_2010.asp    Resulting Agency  [facility]      Result Narrative      Performed at: [facility] [address] [place], [place] [address]        Specimen Collected: 11/29/11 10:42 AM  Last Resulted: 11/30/11 12:51 AM              ANA Status: Final result MyChart: Not Released Next appt with me: None Dx: Arthritis       Notes Recorded by Pearline Cables, MD on 12/02/2011 at 3:28 PM Reached her today and discussed. Referral to rheum done Notes Recorded by Pearline Cables, MD  on 12/01/2011 at 5:03 PM Called and LM on call- will try her back later. She needs to see rheum Notes Recorded by Pearline Cables, MD on 11/30/2011 at 10:26 AM Await other final labs         Value  Range    ANA  POS (A)  NEGATIVE     Resulting Agency  [facility]      Result Narrative      Performed at: [facility] [address], [address] [place], [place] [address]        Specimen Collected: 11/29/11 10:42 AM  Last Resulted: 11/30/11 2:32 PM              Anti-nuclear ab-titer (ANA titer) Status: Final result  MyChart: Not Released Next appt with me: None       Notes Recorded by Pearline Cables, MD on 12/02/2011 at 3:28 PM Reached her today and discussed. Referral to rheum done Notes Recorded by Pearline Cables, MD on 12/01/2011 at 5:03 PM Called and LM on call- will try her back later. She needs to see rheum        Value  Range    ANA Titer 1  1:640 (H)  <1:40     Comments:     Reference Ranges: 1:40 - 1:80 Weakly positive, usually not clinically significant. > or = to 1:160 Result may be clinically significant.     ANA Pattern 1  SPECKLED (A)      Resulting Agency  [facility]      Result Narrative      Performed at: [facility] [address] [place], [place] [address]        Chest:  Clear Heart:  Regular, no murmur  Assessment:  Labs suggest patient may have Lupus  Plan:  Follow up with Dr. Corliss Skains on November 6 ACE level, CK level pending

## 2012-01-13 ENCOUNTER — Telehealth: Payer: Self-pay

## 2012-01-13 NOTE — Telephone Encounter (Signed)
Dr. Milus Glazier,  Pt would like to know if you could go ahead and call in the Prednisone for her.  Patient states that her appt with the specialist is 2 weeks away.    [facility] [address]/[place]  Best#: [phone]

## 2012-01-13 NOTE — Telephone Encounter (Signed)
Dr Patsy Lager, since you had originally seen this pt for hand pain, do you know the plan for putting her on prednisone? If not, we will wait for Dr Milus Glazier to return to office on Tues.

## 2012-01-25 ENCOUNTER — Ambulatory Visit (INDEPENDENT_AMBULATORY_CARE_PROVIDER_SITE_OTHER): Payer: Federal, State, Local not specified - PPO | Admitting: Physician Assistant

## 2012-01-25 VITALS — BP 148/90 | HR 77 | Temp 98.4°F | Resp 18 | Ht 64.5 in | Wt 277.0 lb

## 2012-01-25 DIAGNOSIS — Z23 Encounter for immunization: Secondary | ICD-10-CM

## 2012-01-25 NOTE — Progress Notes (Signed)
Patient presents for pneumococcal vaccine, as advised by her rheumatologist, due to her use of Plaquenil.  However, she had a dose in 2003.  We contacted the rheumatology office and spoke with a staff person to verify the need for repeat dose prior to the patient's 66th birthday (she is 52).  We were advised to "put it on hold" until the patient sees Dr. Corliss Skains again.  We are happy to provide the vaccine if it is needed/recommended.

## 2012-02-05 ENCOUNTER — Ambulatory Visit (INDEPENDENT_AMBULATORY_CARE_PROVIDER_SITE_OTHER): Payer: Federal, State, Local not specified - PPO | Admitting: Family Medicine

## 2012-02-05 ENCOUNTER — Ambulatory Visit: Payer: Federal, State, Local not specified - PPO

## 2012-02-05 VITALS — BP 132/78 | HR 92 | Temp 99.9°F | Resp 36 | Ht 64.0 in | Wt 266.0 lb

## 2012-02-05 DIAGNOSIS — R509 Fever, unspecified: Secondary | ICD-10-CM

## 2012-02-05 DIAGNOSIS — IMO0001 Reserved for inherently not codable concepts without codable children: Secondary | ICD-10-CM

## 2012-02-05 DIAGNOSIS — R05 Cough: Secondary | ICD-10-CM

## 2012-02-05 DIAGNOSIS — R350 Frequency of micturition: Secondary | ICD-10-CM

## 2012-02-05 DIAGNOSIS — J189 Pneumonia, unspecified organism: Secondary | ICD-10-CM

## 2012-02-05 DIAGNOSIS — M791 Myalgia, unspecified site: Secondary | ICD-10-CM

## 2012-02-05 DIAGNOSIS — J029 Acute pharyngitis, unspecified: Secondary | ICD-10-CM

## 2012-02-05 LAB — POCT CBC
Granulocyte percent: 76.7 %G (ref 37–80)
MCV: 81.4 fL (ref 80–97)
MID (cbc): 0.6 (ref 0–0.9)
MPV: 10.4 fL (ref 0–99.8)
POC Granulocyte: 6.1 (ref 2–6.9)
POC LYMPH PERCENT: 16.3 %L (ref 10–50)
POC MID %: 7 %M (ref 0–12)
Platelet Count, POC: 294 10*3/uL (ref 142–424)
RBC: 4.97 M/uL (ref 4.04–5.48)
RDW, POC: 17.1 %

## 2012-02-05 LAB — POCT URINALYSIS DIPSTICK
Bilirubin, UA: NEGATIVE
Glucose, UA: NEGATIVE
Ketones, UA: NEGATIVE
Nitrite, UA: NEGATIVE
pH, UA: 5

## 2012-02-05 LAB — POCT RAPID STREP A (OFFICE): Rapid Strep A Screen: NEGATIVE

## 2012-02-05 LAB — POCT UA - MICROSCOPIC ONLY: Yeast, UA: NEGATIVE

## 2012-02-05 MED ORDER — MAGIC MOUTHWASH W/LIDOCAINE: 5.0000 mL | Freq: Four times a day (QID) | ORAL | Status: DC | PRN

## 2012-02-05 MED ORDER — HYDROCODONE-HOMATROPINE 5-1.5 MG/5ML PO SYRP: ORAL_SOLUTION | ORAL | Status: DC

## 2012-02-05 MED ORDER — LEVOFLOXACIN 500 MG PO TABS: 500.0000 mg | ORAL_TABLET | Freq: Every day | ORAL | Status: DC

## 2012-02-05 NOTE — Patient Instructions (Signed)
Mucinex or Mucinex DM in the morning,  Hycodan cough syrup only if needed at night.  Start antibiotic, magic mouthwash and other symptomatic care as below for your sore throat and cough. Recheck with Dr. Katrinka Blazing in 2 days if not improving (sonner or to the emergency room if worse). If improving - you can follow up with me in 3 days (after 2pm on Wednesday). Return to the clinic or go to the nearest emergency room if any of your symptoms worsen or new symptoms occur.  Sore Throat Sore throats may be caused by bacteria and viruses. They may also be caused by:  Smoking.  Pollution.  Allergies. If a sore throat is due to strep infection (a bacterial infection), you may need:  A throat swab.  A culture test to verify the strep infection. You will need one of these:  An antibiotic shot.  Oral medicine for a full 10 days. Strep infection is very contagious. A doctor should check any close contacts who have a sore throat or fever. A sore throat caused by a virus infection will usually last only 3-4 days. Antibiotics will not treat a viral sore throat.  Infectious mononucleosis (a viral disease), however, can cause a sore throat that lasts for up to 3 weeks. Mononucleosis can be diagnosed with blood tests. You must have been sick for at least 1 week in order for the test to give accurate results. HOME CARE INSTRUCTIONS   To treat a sore throat, take mild pain medicine.  Increase your fluids.  Eat a soft diet.  Do not smoke.  Gargling with warm water or salt water (1 tsp. salt in 8 oz. water) can be helpful.  Try throat sprays or lozenges or sucking on hard candy to ease the symptoms. Call your doctor if your sore throat lasts longer than 1 week.  SEEK IMMEDIATE MEDICAL CARE IF:  You have difficulty breathing.  You have increased swelling in the throat.  You have pain so severe that you are unable to swallow fluids or your saliva.  You have a severe headache, a high fever, vomiting,  or a red rash. Document Released: 04/14/2004 Document Revised: 05/30/2011 Document Reviewed: 02/22/2007 [facility] Patient Information 2013 [place], [place].   Pneumonia, Adult Pneumonia is an infection of the lungs.  CAUSES Pneumonia may be caused by bacteria or a virus. Usually, these infections are caused by breathing infectious particles into the lungs (respiratory tract). SYMPTOMS   Cough.  Fever.  Chest pain.  Increased rate of breathing.  Wheezing.  Mucus production. DIAGNOSIS  If you have the common symptoms of pneumonia, your caregiver will typically confirm the diagnosis with a chest X-ray. The X-ray will show an abnormality in the lung (pulmonary infiltrate) if you have pneumonia. Other tests of your blood, urine, or sputum may be done to find the specific cause of your pneumonia. Your caregiver may also do tests (blood gases or pulse oximetry) to see how well your lungs are working. TREATMENT  Some forms of pneumonia may be spread to other people when you cough or sneeze. You may be asked to wear a mask before and during your exam. Pneumonia that is caused by bacteria is treated with antibiotic medicine. Pneumonia that is caused by the influenza virus may be treated with an antiviral medicine. Most other viral infections must run their course. These infections will not respond to antibiotics.  PREVENTION A pneumococcal shot (vaccine) is available to prevent a common bacterial cause of pneumonia. This is usually  suggested for:  People over 31 years old.  Patients on chemotherapy.  People with chronic lung problems, such as bronchitis or emphysema.  People with immune system problems. If you are over 65 or have a high risk condition, you may receive the pneumococcal vaccine if you have not received it before. In some countries, a routine influenza vaccine is also recommended. This vaccine can help prevent some cases of pneumonia.You may be offered the influenza vaccine as  part of your care. If you smoke, it is time to quit. You may receive instructions on how to stop smoking. Your caregiver can provide medicines and counseling to help you quit. HOME CARE INSTRUCTIONS   Cough suppressants may be used if you are losing too much rest. However, coughing protects you by clearing your lungs. You should avoid using cough suppressants if you can.  Your caregiver may have prescribed medicine if he or she thinks your pneumonia is caused by a bacteria or influenza. Finish your medicine even if you start to feel better.  Your caregiver may also prescribe an expectorant. This loosens the mucus to be coughed up.  Only take over-the-counter or prescription medicines for pain, discomfort, or fever as directed by your caregiver.  Do not smoke. Smoking is a common cause of bronchitis and can contribute to pneumonia. If you are a smoker and continue to smoke, your cough may last several weeks after your pneumonia has cleared.  A cold steam vaporizer or humidifier in your room or home may help loosen mucus.  Coughing is often worse at night. Sleeping in a semi-upright position in a recliner or using a couple pillows under your head will help with this.  Get rest as you feel it is needed. Your body will usually let you know when you need to rest. SEEK IMMEDIATE MEDICAL CARE IF:   Your illness becomes worse. This is especially true if you are elderly or weakened from any other disease.  You cannot control your cough with suppressants and are losing sleep.  You begin coughing up blood.  You develop pain which is getting worse or is uncontrolled with medicines.  You have a fever.  Any of the symptoms which initially brought you in for treatment are getting worse rather than better.  You develop shortness of breath or chest pain. MAKE SURE YOU:   Understand these instructions.  Will watch your condition.  Will get help right away if you are not doing well or get  worse. Document Released: 03/07/2005 Document Revised: 05/30/2011 Document Reviewed: 05/27/2010 [facility] Patient Information 2013 [place], [place].

## 2012-02-05 NOTE — Progress Notes (Signed)
Subjective:    Patient ID: Elizabeth [name], female    DOB: 16-Sep-1950, 61 y.o.   MRN: [id number]  HPI Elizabeth [name] is a 61 y.o. female Raw, sore throat started past 6 days. Running higher temps lately - 101, feels higher fevers this week. Raw throat initially, hurt to swallow, cough past approx 4 days, occasionally productive, unknown color. . No shortness of breath. Has cpap - but not using it for weeks - not using due to sore throat.  Has humidifier inline.  Teeth hurting - upper and lower.   increased urinary frequency past 10 days.  Sore in all muscles, including lower back.  No dysuria.   Recently diagnosed with Sjogren's.  Has been running fevers  - low grade - 99.5 or so,  ongoing for past month or two.  Rheum:  Dr. Corliss Skains.   Just started on plaquenil about 10 days ago.   Insomnia with cough and sore throat.   Review of Systems  HENT: Positive for sore throat and trouble swallowing.   Respiratory: Positive for cough and chest tightness (with cough only. ). Negative for shortness of breath.   Cardiovascular: Negative for chest pain and leg swelling.  Genitourinary: Positive for frequency. Negative for dysuria, urgency, hematuria and flank pain.       Objective:   Physical Exam  Constitutional: She is oriented to person, place, and time. She appears well-developed and well-nourished. No distress.  HENT:  Head: Normocephalic and atraumatic.  Right Ear: Hearing, tympanic membrane, external ear and ear canal normal.  Left Ear: Hearing, tympanic membrane, external ear and ear canal normal.  Nose: Nose normal.  Mouth/Throat: Oropharynx is clear and moist. No oropharyngeal exudate.  Eyes: Conjunctivae normal and EOM are normal. Pupils are equal, round, and reactive to light.  Cardiovascular: Normal rate, regular rhythm, normal heart sounds and intact distal pulses.   No murmur heard. Pulmonary/Chest: Effort normal. No respiratory distress. She has no decreased breath sounds.  She has no wheezes. She has rhonchi in the right middle field and the right lower field. She has no rales.  Abdominal: There is no tenderness.  Musculoskeletal: She exhibits no edema.  Neurological: She is alert and oriented to person, place, and time.  Skin: Skin is warm and dry. No rash noted. She is not diaphoretic.  Psychiatric: She has a normal mood and affect. Her behavior is normal.    [facility] reading (PRIMARY) by  Dr. Neva Seat: CXR - few increased RLL, RML, LLL markings.   Results for orders placed in visit on 02/05/12  POCT CBC      Component Value Range   WBC 8.0  4.6 - 10.2 K/uL   Lymph, poc 1.3  0.6 - 3.4   POC LYMPH PERCENT 16.3  10 - 50 %L   MID (cbc) 0.6  0 - 0.9   POC MID % 7.0  0 - 12 %M   POC Granulocyte 6.1  2 - 6.9   Granulocyte percent 76.7  37 - 80 %G   RBC 4.97  4.04 - 5.48 M/uL   Hemoglobin 12.1 (*) 12.2 - 16.2 g/dL   HCT, POC 44.0  10.2 - 47.9 %   MCV 81.4  80 - 97 fL   MCH, POC 24.3 (*) 27 - 31.2 pg   MCHC 29.9 (*) 31.8 - 35.4 g/dL   RDW, POC 72.5     Platelet Count, POC 294  142 - 424 K/uL   MPV 10.4  0 - 99.8  fL  POCT RAPID STREP A (OFFICE)      Component Value Range   Rapid Strep A Screen Negative  Negative  POCT UA - MICROSCOPIC ONLY      Component Value Range   WBC, Ur, HPF, POC 0-1     RBC, urine, microscopic 0-1     Bacteria, U Microscopic trace     Mucus, UA neg     Epithelial cells, urine per micros 0-1     Crystals, Ur, HPF, POC neg     Casts, Ur, LPF, POC neg     Yeast, UA neg    POCT URINALYSIS DIPSTICK      Component Value Range   Color, UA yellow     Clarity, UA clear     Glucose, UA neg     Bilirubin, UA neg     Ketones, UA neg     Spec Grav, UA 1.015     Blood, UA neg     pH, UA 5.0     Protein, UA neg     Urobilinogen, UA 0.2     Nitrite, UA neg     Leukocytes, UA Trace          Assessment & Plan:  Elizabeth [name] is a 61 y.o. female 1. Urinary frequency  POCT CBC, POCT rapid strep A, POCT UA - Microscopic Only, POCT  urinalysis dipstick, DG Chest 2 View  2. Fever  POCT CBC, POCT rapid strep A, POCT UA - Microscopic Only, POCT urinalysis dipstick, DG Chest 2 View, levofloxacin (LEVAQUIN) 500 MG tablet  3. Cough  POCT CBC, POCT rapid strep A, POCT UA - Microscopic Only, POCT urinalysis dipstick, DG Chest 2 View, levofloxacin (LEVAQUIN) 500 MG tablet, HYDROcodone-homatropine (HYCODAN) 5-1.5 MG/5ML syrup  4. Sore throat  POCT CBC, POCT rapid strep A, POCT UA - Microscopic Only, POCT urinalysis dipstick, DG Chest 2 View, Alum & Mag Hydroxide-Simeth (MAGIC MOUTHWASH W/LIDOCAINE) SOLN  5. Myalgia  POCT CBC, POCT rapid strep A, POCT UA - Microscopic Only, POCT urinalysis dipstick, DG Chest 2 View  6. Pneumonia  levofloxacin (LEVAQUIN) 500 MG tablet     Sore throat with cough - possibly early pneumonia. By history has Sjogren's and recent plaquenil, immune suppression?  Will start levaquin 500mg  qd, mucinex or mucinex DM during day, hycodan only if needed at night, sx care with lozenges, salt water gargles, motrin or tylenol and magic mw if needed.  Recheck in next 48 hours (Dr. Katrinka Blazing) to 72hours Neva Seat after 2pm).  Rtc/er sooner if any worsening - sooner if worse. Understanding expressed.   Urinary frequency - overall reassuring u/a. Will be on levaquin for chest symptoms.   Patient Instructions  Mucinex or Mucinex DM in the morning,  Hycodan cough syrup only if needed at night.  Start antibiotic, magic mouthwash and other symptomatic care as below for your sore throat and cough. Recheck with Dr. Katrinka Blazing in 2 days if not improving (sonner or to the emergency room if worse). If improving - you can follow up with me in 3 days (after 2pm on Wednesday). Return to the clinic or go to the nearest emergency room if any of your symptoms worsen or new symptoms occur.  Sore Throat Sore throats may be caused by bacteria and viruses. They may also be caused by:  Smoking.  Pollution.  Allergies. If a sore throat is due to  strep infection (a bacterial infection), you may need:  A throat swab.  A culture test  to verify the strep infection. You will need one of these:  An antibiotic shot.  Oral medicine for a full 10 days. Strep infection is very contagious. A doctor should check any close contacts who have a sore throat or fever. A sore throat caused by a virus infection will usually last only 3-4 days. Antibiotics will not treat a viral sore throat.  Infectious mononucleosis (a viral disease), however, can cause a sore throat that lasts for up to 3 weeks. Mononucleosis can be diagnosed with blood tests. You must have been sick for at least 1 week in order for the test to give accurate results. HOME CARE INSTRUCTIONS   To treat a sore throat, take mild pain medicine.  Increase your fluids.  Eat a soft diet.  Do not smoke.  Gargling with warm water or salt water (1 tsp. salt in 8 oz. water) can be helpful.  Try throat sprays or lozenges or sucking on hard candy to ease the symptoms. Call your doctor if your sore throat lasts longer than 1 week.  SEEK IMMEDIATE MEDICAL CARE IF:  You have difficulty breathing.  You have increased swelling in the throat.  You have pain so severe that you are unable to swallow fluids or your saliva.  You have a severe headache, a high fever, vomiting, or a red rash. Document Released: 04/14/2004 Document Revised: 05/30/2011 Document Reviewed: 02/22/2007 [facility] Patient Information 2013 [place], [place].   Pneumonia, Adult Pneumonia is an infection of the lungs.  CAUSES Pneumonia may be caused by bacteria or a virus. Usually, these infections are caused by breathing infectious particles into the lungs (respiratory tract). SYMPTOMS   Cough.  Fever.  Chest pain.  Increased rate of breathing.  Wheezing.  Mucus production. DIAGNOSIS  If you have the common symptoms of pneumonia, your caregiver will typically confirm the diagnosis with a chest X-ray. The  X-ray will show an abnormality in the lung (pulmonary infiltrate) if you have pneumonia. Other tests of your blood, urine, or sputum may be done to find the specific cause of your pneumonia. Your caregiver may also do tests (blood gases or pulse oximetry) to see how well your lungs are working. TREATMENT  Some forms of pneumonia may be spread to other people when you cough or sneeze. You may be asked to wear a mask before and during your exam. Pneumonia that is caused by bacteria is treated with antibiotic medicine. Pneumonia that is caused by the influenza virus may be treated with an antiviral medicine. Most other viral infections must run their course. These infections will not respond to antibiotics.  PREVENTION A pneumococcal shot (vaccine) is available to prevent a common bacterial cause of pneumonia. This is usually suggested for:  People over 22 years old.  Patients on chemotherapy.  People with chronic lung problems, such as bronchitis or emphysema.  People with immune system problems. If you are over 65 or have a high risk condition, you may receive the pneumococcal vaccine if you have not received it before. In some countries, a routine influenza vaccine is also recommended. This vaccine can help prevent some cases of pneumonia.You may be offered the influenza vaccine as part of your care. If you smoke, it is time to quit. You may receive instructions on how to stop smoking. Your caregiver can provide medicines and counseling to help you quit. HOME CARE INSTRUCTIONS   Cough suppressants may be used if you are losing too much rest. However, coughing protects you by clearing your  lungs. You should avoid using cough suppressants if you can.  Your caregiver may have prescribed medicine if he or she thinks your pneumonia is caused by a bacteria or influenza. Finish your medicine even if you start to feel better.  Your caregiver may also prescribe an expectorant. This loosens the mucus to  be coughed up.  Only take over-the-counter or prescription medicines for pain, discomfort, or fever as directed by your caregiver.  Do not smoke. Smoking is a common cause of bronchitis and can contribute to pneumonia. If you are a smoker and continue to smoke, your cough may last several weeks after your pneumonia has cleared.  A cold steam vaporizer or humidifier in your room or home may help loosen mucus.  Coughing is often worse at night. Sleeping in a semi-upright position in a recliner or using a couple pillows under your head will help with this.  Get rest as you feel it is needed. Your body will usually let you know when you need to rest. SEEK IMMEDIATE MEDICAL CARE IF:   Your illness becomes worse. This is especially true if you are elderly or weakened from any other disease.  You cannot control your cough with suppressants and are losing sleep.  You begin coughing up blood.  You develop pain which is getting worse or is uncontrolled with medicines.  You have a fever.  Any of the symptoms which initially brought you in for treatment are getting worse rather than better.  You develop shortness of breath or chest pain. MAKE SURE YOU:   Understand these instructions.  Will watch your condition.  Will get help right away if you are not doing well or get worse. Document Released: 03/07/2005 Document Revised: 05/30/2011 Document Reviewed: 05/27/2010 [facility] Patient Information 2013 [place], [place].

## 2012-02-07 ENCOUNTER — Ambulatory Visit (INDEPENDENT_AMBULATORY_CARE_PROVIDER_SITE_OTHER): Payer: Federal, State, Local not specified - PPO | Admitting: Family Medicine

## 2012-02-07 VITALS — BP 122/68 | HR 83 | Temp 98.9°F | Resp 18 | Ht 63.5 in | Wt 265.0 lb

## 2012-02-07 DIAGNOSIS — J0100 Acute maxillary sinusitis, unspecified: Secondary | ICD-10-CM

## 2012-02-07 DIAGNOSIS — M3500 Sicca syndrome, unspecified: Secondary | ICD-10-CM

## 2012-02-07 DIAGNOSIS — J209 Acute bronchitis, unspecified: Secondary | ICD-10-CM

## 2012-02-07 DIAGNOSIS — J029 Acute pharyngitis, unspecified: Secondary | ICD-10-CM

## 2012-02-07 NOTE — Patient Instructions (Addendum)
1. Acute bronchitis   2. Acute pharyngitis   3. Sinusitis, acute maxillary   4. Sjogren's syndrome

## 2012-02-07 NOTE — Progress Notes (Signed)
[address]   [place], [place]  [address]   [phone]  Subjective:    Patient ID: Elizabeth [name], female    DOB: [birth year], 61 y.o.   MRN: [id number]  HPIThis 61 y.o. female presents for 48 hour follow-up of sore throat, cough, fever.  Evaluated by Dr. Neva Seat two days ago; diagnosed with pneumonia; treated with Levaquin and recommended follow-up in 48-72 hours.  Also prescribed Magic Mouthwash and Hycodan cough syrup. Recently diagnosed with Sjogren's and initiated on Plaquenil; has been holding Plaquenil with acute symptoms.  Onset of symptoms eight days ago.  Much improved from visit 48 hours ago.  In upper chest, hurts with coughing; also feels like needs to belch but does not relief chest pressure.  Also has headache and teeth hurting.  No fever since last visit; last fever two days ago.  +HA located B temples and radiates into maxillary sinuses; teeth still hurt.  Throat no longer raw; able to swallow better.  Two nights ago, took Levaquin, [employer], [place]; not sure if able to take codeine.  Tip of tongue is really sensitive.  Has not used Magic Mouthwash or cough syrup since first dose.  Has used Mucinex and antibiotic.  Mild rhinorrhea; clear.  No nasal congestion.  Rattling in chest.  When sitting up, feels better.  With lying supine, coughs more.  +PND.  Scant sputum production.  No n/v/d.  Stress incontinence with coughing; wearing poise pad.  100% improved since Sunday.  No flu vaccine yet; had flu vaccine and Pneumovax at same time years ago and got very ill; has not gotten flu vaccine since that time.   Only using Mucinex once daily; feels like needs to get up congestion but unable to.   PCP: [place]  Review of Systems  Constitutional: Positive for fatigue. Negative for fever, chills and diaphoresis.  HENT: Positive for sore throat, rhinorrhea, voice change and postnasal drip. Negative for ear pain, congestion, mouth sores and trouble swallowing.   Respiratory: Positive  for cough. Negative for shortness of breath and wheezing.   Cardiovascular: Positive for chest pain.  Gastrointestinal: Negative for nausea, vomiting and diarrhea.  Skin: Negative for rash.    Past Medical History  Diagnosis Date  . Abnormal EKG   . Anemia   . Arthritis   . Hypertension     Past Surgical History  Procedure Date  . Cholecystectomy   . Abdominal hysterectomy     Prior to Admission medications   Medication Sig Start Date End Date Taking? Authorizing Provider  Alum & Mag Hydroxide-Simeth (MAGIC MOUTHWASH W/LIDOCAINE) SOLN Take 5 mLs by mouth 4 (four) times daily as needed. 02/05/12  Yes Shade Flood, MD  amLODipine (NORVASC) 10 MG tablet Take 1 tablet (10 mg total) by mouth daily. 08/18/11  Yes Elvina Sidle, MD  levofloxacin (LEVAQUIN) 500 MG tablet Take 1 tablet (500 mg total) by mouth daily. 02/05/12  Yes Shade Flood, MD  olmesartan (BENICAR) 20 MG tablet Take 1 tablet (20 mg total) by mouth daily. 08/18/11 08/17/12 Yes Elvina Sidle, MD  Vitamin D, Ergocalciferol, (DRISDOL) 50000 UNITS CAPS Take 50,000 Units by mouth.   Yes Historical Provider, MD  HYDROcodone-homatropine (HYCODAN) 5-1.5 MG/5ML syrup 89m by mouth a bedtime as needed for cough. 02/05/12   Shade Flood, MD  hydroxychloroquine (PLAQUENIL) 200 MG tablet Take 200 mg by mouth 2 (two) times daily.    Historical Provider, MD    No Known Allergies  History  Social History  . Marital Status: Single    Spouse Name: N/A    Number of Children: N/A  . Years of Education: N/A   Occupational History  . Not on file.   Social History Main Topics  . Smoking status: Never Smoker   . Smokeless tobacco: Not on file  . Alcohol Use: No  . Drug Use: No  . Sexually Active: No   Other Topics Concern  . Not on file   Social History Narrative  . No narrative on file    Family History  Problem Relation Age of Onset  . Arthritis Mother   . Diabetes Mother   . Hypertension Mother   .  Heart disease Father   . Hypertension Father   . Hypertension Brother        Objective:   Physical Exam  Nursing note and vitals reviewed. Constitutional: She is oriented to person, place, and time. She appears well-developed and well-nourished. No distress.  HENT:  Head: Normocephalic and atraumatic.  Right Ear: External ear normal.  Left Ear: External ear normal.  Nose: Right sinus exhibits maxillary sinus tenderness. Right sinus exhibits no frontal sinus tenderness. Left sinus exhibits maxillary sinus tenderness. Left sinus exhibits no frontal sinus tenderness.  Mouth/Throat: Oropharynx is clear and moist and mucous membranes are normal. Normal dentition. No uvula swelling. No oropharyngeal exudate.  Eyes: Conjunctivae normal and EOM are normal. Pupils are equal, round, and reactive to light.  Neck: Normal range of motion. Neck supple.  Cardiovascular: Normal rate, regular rhythm and normal heart sounds.  Exam reveals no gallop and no friction rub.   No murmur heard. Pulmonary/Chest: Effort normal and breath sounds normal. No respiratory distress. She has no wheezes. She has no rales. She exhibits no tenderness.  Musculoskeletal: She exhibits no edema.  Lymphadenopathy:    She has cervical adenopathy.  Neurological: She is alert and oriented to person, place, and time. No cranial nerve deficit.  Skin: Skin is warm and dry. She is not diaphoretic.  Psychiatric: She has a normal mood and affect. Her behavior is normal. Judgment and thought content normal.      Assessment & Plan:   1. Acute bronchitis   2. Acute pharyngitis   3. Sinusitis, acute maxillary   4. Sjogren's syndrome      1.  Acute bronchitis:  Improving form visit 48 hours ago.  Continue Levaquin therapy; increase Mucinex to bid.  RTC for acute worsening. 2.  Acute Pharyngitis: improving.  Complete Levaquin therapy; intolerance to Magic Mouthwash and agreeable to d/c. 3.  Acute Maxillary Sinusitis: Improving;  complete Levaquin. 4.  Sjogren's syndrome: stable.  Spoke with rheumatology yesterday; to restart Plaquenil in one week.

## 2012-05-19 ENCOUNTER — Encounter: Payer: Self-pay | Admitting: *Deleted

## 2012-05-19 DIAGNOSIS — M359 Systemic involvement of connective tissue, unspecified: Secondary | ICD-10-CM | POA: Insufficient documentation

## 2012-05-23 ENCOUNTER — Ambulatory Visit (HOSPITAL_COMMUNITY)
Admission: RE | Admit: 2012-05-23 | Discharge: 2012-05-23 | Disposition: A | Discharge status: Home / Self Care | Payer: Federal, State, Local not specified - PPO | Source: Ambulatory Visit | Attending: Rheumatology | Admitting: Rheumatology

## 2012-05-23 ENCOUNTER — Other Ambulatory Visit (HOSPITAL_COMMUNITY): Payer: Self-pay | Admitting: Rheumatology

## 2012-05-23 DIAGNOSIS — R0602 Shortness of breath: Secondary | ICD-10-CM | POA: Insufficient documentation

## 2012-05-30 ENCOUNTER — Ambulatory Visit (INDEPENDENT_AMBULATORY_CARE_PROVIDER_SITE_OTHER): Payer: Federal, State, Local not specified - PPO | Admitting: Pulmonary Disease

## 2012-05-30 ENCOUNTER — Encounter: Payer: Self-pay | Admitting: Pulmonary Disease

## 2012-05-30 VITALS — BP 150/80 | HR 81 | Temp 98.2°F | Ht 64.0 in | Wt 281.2 lb

## 2012-05-30 DIAGNOSIS — R0600 Dyspnea, unspecified: Secondary | ICD-10-CM

## 2012-05-30 NOTE — Patient Instructions (Signed)
Will schedule breathing test (PFT) and CT chest Will get recent lab tests from Dr. Corliss Skains Will get tests results from Dr. Jacinto Halim Follow up in 2 to 3 weeks

## 2012-05-30 NOTE — Progress Notes (Signed)
Chief Complaint  Patient presents with  . Advice Only    c/o SOB w/ activity. Can't walk a block w/o feeling SOB and chest teels tx.     History of Present Illness: Elizabeth [name] is a 62 y.o. female for evaluation of dyspnea.  She is followed by Dr. Corliss Skains with rheumatology for Sjogren's disease.  She first had dry eyes and mouth, but then later developed headaches with intermittent fever/chills.  She was started on plaquenil, and her symptoms have improved.  She was treated for pneumonia in December 2013 for pneumonia with levaquin at home.  She gets frequent episodes of pneumonia.  Since then she has noticed more trouble with her breathing.  She can now only walk 1 block before getting winded.  She will feel very tired, and tight in her chest.  There is no prior history of asthma, but did have trouble with wheezing years ago.  She denies cough, chest congestion, or sinus congestion.  She has not had wheezing recently.  Her hands have been hurting, and she has trouble folding her hands.  She denies history of leg/lung clots, or leg swelling.  She has not had skin rash.  She is from [place], and was in [place] 11/30/99, but only for one day.  She denies animal exposures.  She has never smoked.  She denies occupational exposures.    She reports having cardiac evaluation with Dr. Jacinto Halim in June 2013, and was told her heart health was okay.  She has sleep apnea, and uses her CPAP every night.  She joined [employer] recently.  Tests:   [name]  has a past medical history of Abnormal EKG; Anemia; Hypertension; Sjogren's syndrome (03/22/2011); and OSA (obstructive sleep apnea).  Naveah A Vessell  has past surgical history that includes Cholecystectomy; Abdominal hysterectomy; and paratoid tumor removed.  Prior to Admission medications   Medication Sig Start Date End Date Taking? Authorizing   Alum & Mag Hydroxide-Simeth (MAGIC MOUTHWASH W/LIDOCAINE) SOLN Take 5  mLs by mouth 4 (four) times daily as needed. 02/05/12   Shade Flood, MD  amLODipine (NORVASC) 10 MG tablet Take 1 tablet (10 mg total) by mouth daily. 08/18/11   Elvina Sidle, MD  HYDROcodone-homatropine [facility]) 5-1.5 MG/5ML syrup 47m by mouth a bedtime as needed for cough. 02/05/12   Shade Flood, MD  hydroxychloroquine (PLAQUENIL) 200 MG tablet Take 200 mg by mouth 2 (two) times daily.    Historical , MD  levofloxacin (LEVAQUIN) 500 MG tablet Take 1 tablet (500 mg total) by mouth daily. 02/05/12   Shade Flood, MD  olmesartan (BENICAR) 20 MG tablet Take 1 tablet (20 mg total) by mouth daily. 08/18/11 08/17/12  Elvina Sidle, MD  Vitamin D, Ergocalciferol, (DRISDOL) 50000 UNITS CAPS Take 50,000 Units by mouth.    Historical , MD    No Known Allergies  family history includes Arthritis in her mother; Asthma in her paternal grandmother; Diabetes in her mother; Heart disease in her father; and Hypertension in her brother, father, and mother.   reports that she has never smoked. She does not have any smokeless tobacco history on file. She reports that she does not drink alcohol or use illicit drugs.   Physical Exam:  General - No distress ENT - No sinus tenderness, no oral exudate, no LAN, no thyromegaly, TM clear, pupils equal/reactive Cardiac - s1s2 regular, no murmur, pulses symmetric Chest - No wheeze/rales/dullness, good air entry, normal respiratory excursion Back -  No focal tenderness Abd - Soft, non-tender, no organomegaly, + bowel sounds Ext - No edema Neuro - Normal strength, cranial nerves intact Skin - No rashes Psych - Normal mood, and behavior.   05/23/2012  *RADIOLOGY REPORT*   Clinical Data: Shortness of breath.   CHEST - 2 VIEW   Comparison: Single view of the chest 06/27/2006.  PA and lateral chest 08/19/2008.   Findings: The lungs are clear.  Heart size is upper normal.  No pneumothorax or pleural effusion.   IMPRESSION: No acute  finding.  Stable compared to prior exam.    Original Report Authenticated By: Holley Dexter, M.D.    Assessment/Plan:  Coralyn Helling, MD [facility] Pager:  [phone] 05/30/2012, 1:53 PM

## 2012-05-31 ENCOUNTER — Ambulatory Visit (INDEPENDENT_AMBULATORY_CARE_PROVIDER_SITE_OTHER)
Admission: RE | Admit: 2012-05-31 | Discharge: 2012-05-31 | Disposition: A | Discharge status: Home / Self Care | Payer: Federal, State, Local not specified - PPO | Source: Ambulatory Visit | Attending: Pulmonary Disease | Admitting: Pulmonary Disease

## 2012-05-31 DIAGNOSIS — R0609 Other forms of dyspnea: Secondary | ICD-10-CM

## 2012-05-31 MED ORDER — IOHEXOL 300 MG/ML  SOLN
80.0000 mL | Freq: Once | INTRAMUSCULAR | Status: AC | PRN
  Administered 2012-05-31: 80 mL via INTRAVENOUS

## 2012-06-01 ENCOUNTER — Telehealth: Payer: Self-pay | Admitting: Pulmonary Disease

## 2012-06-01 NOTE — Telephone Encounter (Signed)
05/31/2012  *RADIOLOGY REPORT*   Clinical Data: Progressive shortness of breath with chest tightness on exertion.  History of Sjogren's and lupus.   CT CHEST WITH CONTRAST   Technique:  Multidetector CT imaging of the chest was performed following the standard protocol during bolus administration of intravenous contrast.  Contrast: 80mL OMNIPAQUE IOHEXOL 300 MG/ML  SOLN   Comparison: Chest radiograph stating back to 06/27/2006. Findings: No pathologically enlarged mediastinal or hilar lymph nodes.  There are several axillary nodes bilaterally, measuring up to 1.7 cm on the right.  The largest lymph node has a fatty hilum. Heart size normal.  No pericardial effusion.  There are small pre pericardiac lymph nodes.  Small lymph nodes seen at the diaphragmatic hiatus as well.  High resolution imaging shows subtle subpleural reticulation along the periphery of the lower lobes bilaterally.  No definite associated traction bronchiolectasis.  No honeycombing.  Lungs are otherwise clear.  There may be minimal air trapping on expiratory phase imaging.  No pleural fluid.  Airway is unremarkable.  Incidental imaging of the upper abdomen shows a sub centimeter low attenuation lesion in the dome of the liver.  Liver appears decreased in attenuation diffusely.  No worrisome lytic or sclerotic lesions. IMPRESSION:   1.  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  2.  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  3.  Hepatic steatosis.    Original Report Authenticated By: Leanna Battles, M.D.     Results d/w pt.  She has PFT scheduled for 06/14/12 and ROV same day.  Will discuss tx options after reviewing PFT.

## 2012-06-13 ENCOUNTER — Telehealth: Payer: Self-pay | Admitting: Pulmonary Disease

## 2012-06-13 NOTE — Telephone Encounter (Signed)
Pt states that she is having sinus congestion, sore throat, cough with production of clear mucus. She is wondering if she should keep her appointments tomorrow.  Per VS - pt should not do PFT but she can come for her follow up.  Pt is aware. PFT appointment will be canceled.

## 2012-06-14 ENCOUNTER — Ambulatory Visit (INDEPENDENT_AMBULATORY_CARE_PROVIDER_SITE_OTHER): Payer: Federal, State, Local not specified - PPO | Admitting: Pulmonary Disease

## 2012-06-14 ENCOUNTER — Encounter: Payer: Self-pay | Admitting: Pulmonary Disease

## 2012-06-14 VITALS — BP 112/70 | HR 73 | Temp 100.1°F | Ht 64.5 in | Wt 271.6 lb

## 2012-06-14 DIAGNOSIS — R0600 Dyspnea, unspecified: Secondary | ICD-10-CM

## 2012-06-14 DIAGNOSIS — J069 Acute upper respiratory infection, unspecified: Secondary | ICD-10-CM

## 2012-06-14 DIAGNOSIS — R0989 Other specified symptoms and signs involving the circulatory and respiratory systems: Secondary | ICD-10-CM

## 2012-06-14 NOTE — Progress Notes (Signed)
Chief Complaint  Patient presents with  . Follow-up    Pt reports breathing is about the same since last visit-- denies any concerns at this time    History of Present Illness: Elizabeth [name] is a 62 y.o. female with dyspnea with history of connective tissue disease.  Since her last visit she had CT chest.  She was to have PFT today, but has developed an upper respiratory infection.  For the past several days she has cough with scratchy throat.  She has sinus congestion with sneezing, and blowing her nose a lot.  She has been feeling hoarse.  She is bringing up clear sputum.  She had episode of wheezing with cough two days ago, but not since.  She had temp of 100.1.  She denies rash, gland swelling, nausea, or diarrhea.  Tests: Lexiscan myocardial perfusion study 08/26/11 >> no ischemia PSG 08/28/11 >> AHI 10.4, SpO2 low 82% Echo 08/31/11 >> EF 60%, mild/mod LVH Labs 01/13/12 >> ANA >1:2560, Smith Ab 249, Sm/RNP 155 CT chest 05/31/12 >> b/l axillary nodes up to 1.7 cm on Rt, subtle sub-pleural reticulation b/l periphery of lower lobes, minimal air-trapping, hepatic steatosis  Elizabeth [name]  has a past medical history of Abnormal EKG; Anemia; Hypertension; Sjogren's syndrome (03/22/2011); and OSA (obstructive sleep apnea).  Elizabeth [name]  has past surgical history that includes Cholecystectomy; Abdominal hysterectomy; and paratoid tumor removed.  Prior to Admission medications   Medication Sig Start Date End Date Taking? Authorizing Provider  Alum & Mag Hydroxide-Simeth (MAGIC MOUTHWASH W/LIDOCAINE) SOLN Take 5 mLs by mouth 4 (four) times daily as needed. 02/05/12   Shade Flood, MD  amLODipine (NORVASC) 10 MG tablet Take 1 tablet (10 mg total) by mouth daily. 08/18/11   Elvina Sidle, MD  HYDROcodone-homatropine [facility]) 5-1.5 MG/5ML syrup 56m by mouth a bedtime as needed for cough. 02/05/12   Shade Flood, MD  hydroxychloroquine (PLAQUENIL) 200 MG tablet Take 200 mg by mouth 2  (two) times daily.    Historical Provider, MD  levofloxacin (LEVAQUIN) 500 MG tablet Take 1 tablet (500 mg total) by mouth daily. 02/05/12   Shade Flood, MD  olmesartan (BENICAR) 20 MG tablet Take 1 tablet (20 mg total) by mouth daily. 08/18/11 08/17/12  Elvina Sidle, MD  Vitamin D, Ergocalciferol, (DRISDOL) 50000 UNITS CAPS Take 50,000 Units by mouth.    Historical Provider, MD    No Known Allergies  family history includes Arthritis in her mother; Asthma in her paternal grandmother; Diabetes in her mother; Heart disease in her father; and Hypertension in her brother, father, and mother.   reports that she has never smoked. She has never used smokeless tobacco. She reports that she does not drink alcohol or use illicit drugs.   Physical Exam:  General - No distress ENT - No sinus tenderness, clear nasal discharge, no oral exudate, no LAN Cardiac - s1s2 regular, no murmur, pulses symmetric Chest - No wheeze/rales/dullness Back - No focal tenderness Abd - Soft, non-tender Ext - No edema Neuro - Normal strength Skin - No rashes Psych - Normal mood  05/31/2012  *RADIOLOGY REPORT*   Clinical Data: Progressive shortness of breath with chest tightness on exertion.  History of Sjogren's and lupus.   CT CHEST WITH CONTRAST  Technique:  Multidetector CT imaging of the chest was performed following the standard protocol during bolus administration of intravenous contrast.   Contrast: 80mL OMNIPAQUE IOHEXOL 300 MG/ML  SOLN   Comparison: Chest radiograph stating  back to 06/27/2006.   Findings: No pathologically enlarged mediastinal or hilar lymph nodes.  There are several axillary nodes bilaterally, measuring up to 1.7 cm on the right.  The largest lymph node has a fatty hilum. Heart size normal.  No pericardial effusion.  There are small pre pericardiac lymph nodes.  Small lymph nodes seen at the diaphragmatic hiatus as well.  High resolution imaging shows subtle subpleural reticulation  along the periphery of the lower lobes bilaterally.  No definite associated traction bronchiolectasis.  No honeycombing.  Lungs are otherwise clear.  There may be minimal air trapping on expiratory phase imaging.  No pleural fluid.  Airway is unremarkable.  Incidental imaging of the upper abdomen shows a sub centimeter low attenuation lesion in the dome of the liver.  Liver appears decreased in attenuation diffusely.  No worrisome lytic or sclerotic lesions.   IMPRESSION:   1.  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  2.  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  3.  Hepatic steatosis.    Original Report Authenticated By: Leanna Battles, M.D.     Assessment/Plan:  Coralyn Helling, MD [place] [facility]/Critical [facility]/Sleep Pager:  [phone] 06/14/2012, 4:28 PM

## 2012-06-14 NOTE — Patient Instructions (Signed)
Will reschedule breathing test (PFT) to be done in next two to three weeks >> will call with results Follow up in 8 weeks

## 2012-06-14 NOTE — Assessment & Plan Note (Signed)
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  Her recent chest xray was unremarkable, but I am concerned should could have subtle pulmonary parenchymal findings that would not be apparent on chest xray.  To further assess will arrange for HRCT chest and PFT's.  Will defer further interventions until these are reviewed.  Will get copies of her cardiac evaluation from Dr. Jacinto Halim.  She reports also having recent lab work with  Dr. Corliss Skains >> will get copy of these, and then determine if she needs additional lab work.

## 2012-06-17 DIAGNOSIS — J069 Acute upper respiratory infection, unspecified: Secondary | ICD-10-CM | POA: Insufficient documentation

## 2012-06-17 NOTE — Assessment & Plan Note (Addendum)
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  She had subtle radiographic findings on HRCT chest.  Will reschedule PFT's after she recovers from her URI and call her with results.  Will then determine what additional interventions are needed in conjunction with rheumatology.

## 2012-06-17 NOTE — Assessment & Plan Note (Signed)
Her current symptoms are likely related to virus.  She has clinical improvement.  Advised symptomatic therapy for now.  Advised she should call if her symptoms progress, but will defer antibiotic therapy for now.

## 2012-06-20 ENCOUNTER — Ambulatory Visit (HOSPITAL_COMMUNITY)
Admission: RE | Admit: 2012-06-20 | Discharge: 2012-06-20 | Disposition: A | Discharge status: Home / Self Care | Payer: Federal, State, Local not specified - PPO | Source: Ambulatory Visit | Attending: Pulmonary Disease | Admitting: Pulmonary Disease

## 2012-06-20 DIAGNOSIS — R0609 Other forms of dyspnea: Secondary | ICD-10-CM | POA: Insufficient documentation

## 2012-06-20 DIAGNOSIS — J988 Other specified respiratory disorders: Secondary | ICD-10-CM | POA: Insufficient documentation

## 2012-06-20 DIAGNOSIS — R0989 Other specified symptoms and signs involving the circulatory and respiratory systems: Secondary | ICD-10-CM | POA: Insufficient documentation

## 2012-06-20 DIAGNOSIS — R059 Cough, unspecified: Secondary | ICD-10-CM | POA: Insufficient documentation

## 2012-06-20 DIAGNOSIS — R0600 Dyspnea, unspecified: Secondary | ICD-10-CM

## 2012-06-20 DIAGNOSIS — R05 Cough: Secondary | ICD-10-CM | POA: Insufficient documentation

## 2012-06-20 MED ORDER — ALBUTEROL SULFATE (5 MG/ML) 0.5% IN NEBU
2.5000 mg | INHALATION_SOLUTION | Freq: Once | RESPIRATORY_TRACT | Status: AC
  Administered 2012-06-20: 2.5 mg via RESPIRATORY_TRACT

## 2012-07-10 ENCOUNTER — Telehealth: Payer: Self-pay | Admitting: Pulmonary Disease

## 2012-07-10 NOTE — Telephone Encounter (Signed)
PFT 06/20/12 >> FEV1 1.45 (68%), FEV1% 82, TLC 4.14 (80%), DLCO 51%, no BD  PFT shows mild obstruction, normal lung volumes, and moderate diffusion defect.  Advised pt to call back to discuss PFT results.  Will route message to my nurse to be aware of plan.

## 2012-07-11 NOTE — Telephone Encounter (Signed)
Patient returning call.  [phone]

## 2012-07-11 NOTE — Telephone Encounter (Signed)
Please advise Dr. Sood thanks 

## 2012-07-11 NOTE — Telephone Encounter (Signed)
Results of PFT d/w pt.  She has mild diffusion defect, otherwise PFT normal.  She has lost about 20 lbs with weight watchers, and her breathing is doing better.  Will discuss in more detail at [facility].  Continue current therapy for now.

## 2012-08-09 ENCOUNTER — Ambulatory Visit (INDEPENDENT_AMBULATORY_CARE_PROVIDER_SITE_OTHER)
Admission: RE | Admit: 2012-08-09 | Discharge: 2012-08-09 | Disposition: A | Discharge status: Home / Self Care | Payer: Federal, State, Local not specified - PPO | Source: Ambulatory Visit | Attending: Pulmonary Disease | Admitting: Pulmonary Disease

## 2012-08-09 ENCOUNTER — Ambulatory Visit (INDEPENDENT_AMBULATORY_CARE_PROVIDER_SITE_OTHER): Payer: Federal, State, Local not specified - PPO | Admitting: Pulmonary Disease

## 2012-08-09 ENCOUNTER — Encounter: Payer: Self-pay | Admitting: Pulmonary Disease

## 2012-08-09 VITALS — BP 114/62 | HR 52 | Temp 99.1°F | Ht 64.5 in | Wt 256.2 lb

## 2012-08-09 DIAGNOSIS — R0600 Dyspnea, unspecified: Secondary | ICD-10-CM

## 2012-08-09 DIAGNOSIS — R0609 Other forms of dyspnea: Secondary | ICD-10-CM

## 2012-08-09 DIAGNOSIS — R0989 Other specified symptoms and signs involving the circulatory and respiratory systems: Secondary | ICD-10-CM

## 2012-08-09 NOTE — Patient Instructions (Signed)
Chest xray today >> will call with results Will schedule 6 minute walk test Follow up in 2 months

## 2012-08-09 NOTE — Progress Notes (Signed)
Chief Complaint  Patient presents with  . Follow-up    pt reports breathing is doing well-- pt denies any new concerns at this time    History of Present Illness: Elizabeth [name] is a 62 y.o. female with dyspnea with history of connective tissue disease.  She feels her breathing is better.  She denies cough, wheeze, sputum, chest pain, or hemoptysis.  She is not having skin rashes, joint pain, or gland swelling.  She does senior exercise classes several times per week >> she does not have any difficulty doing these.  She feels her breathing is better with weight loss.  Wt Readings from Last 10 Encounters:  08/09/12 256 lb 3.2 oz (116.212 kg)  06/14/12 271 lb 9.6 oz (123.197 kg)  05/30/12 281 lb 3.2 oz (127.551 kg)  02/07/12 265 lb (120.203 kg)  02/05/12 266 lb (120.657 kg)  01/25/12 277 lb (125.646 kg)  01/12/12 275 lb (124.739 kg)  12/29/11 277 lb (125.646 kg)  11/25/11 275 lb (124.739 kg)  08/18/11 270 lb 12.8 oz (122.834 kg)   She had repeat lab work with Dr. Corliss Skains.  This showed persistent elevation in ANA and ESR.  She was advised to ask whether imuran could be an option for her.  Tests: Lexiscan myocardial perfusion study 08/26/11 >> no ischemia PSG 08/28/11 >> AHI 10.4, SpO2 low 82% Echo 08/31/11 >> EF 60%, mild/mod LVH Labs 01/13/12 >> ANA >1:2560, Smith Ab 249, Sm/RNP 155 CT chest 05/31/12 >> b/l axillary nodes up to 1.7 cm on Rt, subtle sub-pleural reticulation b/l periphery of lower lobes, minimal air-trapping, hepatic steatosis PFT 06/20/12 >> FEV1 1.45 (68%), FEV1% 82, TLC 4.14 (80%), DLCO 51%, no BD Labs 08/02/12 >> ESR 55, ANA >1:2560  Elizabeth [name]  has a past medical history of Abnormal EKG; Anemia; Hypertension; Sjogren's syndrome (03/22/2011); and OSA (obstructive sleep apnea).  Elizabeth [name]  has past surgical history that includes Cholecystectomy; Abdominal hysterectomy; and paratoid tumor removed.  Prior to Admission medications   Medication Sig Start  Date End Date Taking? Authorizing Provider  Alum & Mag Hydroxide-Simeth (MAGIC MOUTHWASH W/LIDOCAINE) SOLN Take 5 mLs by mouth 4 (four) times daily as needed. 02/05/12   Shade Flood, MD  amLODipine (NORVASC) 10 MG tablet Take 1 tablet (10 mg total) by mouth daily. 08/18/11   Elvina Sidle, MD  HYDROcodone-homatropine [facility]) 5-1.5 MG/5ML syrup 82m by mouth a bedtime as needed for cough. 02/05/12   Shade Flood, MD  hydroxychloroquine (PLAQUENIL) 200 MG tablet Take 200 mg by mouth 2 (two) times daily.    Historical Provider, MD  levofloxacin (LEVAQUIN) 500 MG tablet Take 1 tablet (500 mg total) by mouth daily. 02/05/12   Shade Flood, MD  olmesartan (BENICAR) 20 MG tablet Take 1 tablet (20 mg total) by mouth daily. 08/18/11 08/17/12  Elvina Sidle, MD  Vitamin D, Ergocalciferol, (DRISDOL) 50000 UNITS CAPS Take 50,000 Units by mouth.    Historical Provider, MD    No Known Allergies  family history includes Arthritis in her mother; Asthma in her paternal grandmother; Diabetes in her mother; Heart disease in her father; and Hypertension in her brother, father, and mother.   reports that she has never smoked. She has never used smokeless tobacco. She reports that she does not drink alcohol or use illicit drugs.   Physical Exam:  General - No distress ENT - No sinus tenderness, clear nasal discharge, no oral exudate, no LAN Cardiac - s1s2 regular, no murmur, pulses symmetric  Chest - No wheeze/rales/dullness Back - No focal tenderness Abd - Soft, non-tender Ext - No edema Neuro - Normal strength Skin - No rashes Psych - Normal mood   Assessment/Plan:  Elizabeth Helling, MD [place] [facility]/Critical Care/Sleep Pager:  [phone] 08/09/2012, 3:02 PM

## 2012-08-11 ENCOUNTER — Telehealth: Payer: Self-pay | Admitting: Pulmonary Disease

## 2012-08-11 NOTE — Assessment & Plan Note (Addendum)
She presented with dyspnea in setting of auto-immune disease.  She has subtle findings on CT chest from March 2014.  She also has isolated diffusion defect on PFT from April 2014.  She reports subjective improvement in respiratory symptoms with weight loss, and denies any significant respiratory complaints at present.  Will repeat her chest xray today and arrange for 6 minute walk test.  Based on results will determine when to follow up CT chest and PFT.  Main concern is whether she should be started on immunomodulating therapy >> respiratory status is stable at present, and may be best to continue close clinical observation.

## 2012-08-11 NOTE — Telephone Encounter (Signed)
Dg Chest 2 View  08/09/2012   *RADIOLOGY REPORT*  Clinical Data: History of dyspnea on exertion.  History of hypertension.  Follow-up.  History of connective tissue disease.  CHEST - 2 VIEW  Comparison: 05/23/2012.  CT 05/31/2012.  Findings: Cardiac silhouette is normal size and shape.  No mediastinal or hilar lesions are seen.  Mediastinal and hilar contours appear stable.  No pulmonary infiltrates or masses are seen. No pleural abnormality is evident.  There is minimal central peribronchial thickening.  No peripheral infiltrate or consolidation is evident.  Changes of degenerative disc disease and degenerative spondylosis are present.  There is slightly osteopenic appearance of the bones.  IMPRESSION: Cardiac silhouette size is normal.  No peripheral pulmonary infiltrate consolidation is evident.  There is minimal central peribronchial thickening.  Osteopenic appearance of the bones.   Original Report Authenticated By: Onalee Hua Call    Will have my nurse inform pt that CXR looks okay.  No change to current treatment plan as detailed at [facility] 08/09/12.

## 2012-08-16 NOTE — Telephone Encounter (Signed)
I spoke with patient about results and she verbalized understanding and had no questions 

## 2012-10-10 ENCOUNTER — Encounter: Payer: Self-pay | Admitting: Pulmonary Disease

## 2012-10-10 ENCOUNTER — Ambulatory Visit: Payer: Federal, State, Local not specified - PPO | Admitting: Pulmonary Disease

## 2012-10-10 ENCOUNTER — Ambulatory Visit (INDEPENDENT_AMBULATORY_CARE_PROVIDER_SITE_OTHER): Payer: Federal, State, Local not specified - PPO | Admitting: Pulmonary Disease

## 2012-10-10 VITALS — BP 128/72 | HR 66 | Ht 64.5 in | Wt 247.0 lb

## 2012-10-10 DIAGNOSIS — R0609 Other forms of dyspnea: Secondary | ICD-10-CM

## 2012-10-10 DIAGNOSIS — R0600 Dyspnea, unspecified: Secondary | ICD-10-CM

## 2012-10-10 DIAGNOSIS — R0989 Other specified symptoms and signs involving the circulatory and respiratory systems: Secondary | ICD-10-CM

## 2012-10-10 NOTE — Patient Instructions (Signed)
Follow up in 6 months 

## 2012-10-10 NOTE — Assessment & Plan Note (Signed)
She has subtle findings on CT chest from March 2014.  Her respiratory symptoms have improved with weight loss.  Most recent CXR was unremarkable.  Will continue clinical monitoring, but do not think she needs immunomodulating therapies at this time.

## 2012-10-10 NOTE — Progress Notes (Signed)
Chief Complaint  Patient presents with  . Shortness of Breath    Breathing has improved. Reports SOB from time to time. Denies chest tightness, coughing or wheezing.    History of Present Illness: Elizabeth [name] is a 62 y.o. female with dyspnea with history of connective tissue disease.  She continues to lose weight with diet changes and exercise.  She is not having cough, wheeze, sputum, or chest pain.  She still gets dry mouth.  She is not having joint pain/swelling, or skin rashes.  Wt Readings from Last 10 Encounters:  10/10/12 247 lb (112.038 kg)  08/09/12 256 lb 3.2 oz (116.212 kg)  06/14/12 271 lb 9.6 oz (123.197 kg)  05/30/12 281 lb 3.2 oz (127.551 kg)  02/07/12 265 lb (120.203 kg)  02/05/12 266 lb (120.657 kg)  01/25/12 277 lb (125.646 kg)  01/12/12 275 lb (124.739 kg)  12/29/11 277 lb (125.646 kg)  11/25/11 275 lb (124.739 kg)    Tests: Lexiscan myocardial perfusion study 08/26/11 >> no ischemia PSG 08/28/11 >> AHI 10.4, SpO2 low 82% Echo 08/31/11 >> EF 60%, mild/mod LVH Labs 01/13/12 >> ANA >1:2560, Smith Ab 249, Sm/RNP 155 CT chest 05/31/12 >> b/l axillary nodes up to 1.7 cm on Rt, subtle sub-pleural reticulation b/l periphery of lower lobes, minimal air-trapping, hepatic steatosis PFT 06/20/12 >> FEV1 1.45 (68%), FEV1% 82, TLC 4.14 (80%), DLCO 51%, no BD Labs 08/02/12 >> ESR 55, ANA >1:2560 10/10/12 >> 450 meters  Elizabeth [name]  has a past medical history of Abnormal EKG; Anemia; Hypertension; Sjogren's syndrome (03/22/2011); and OSA (obstructive sleep apnea).  Elizabeth [name]  has past surgical history that includes Cholecystectomy; Abdominal hysterectomy; and paratoid tumor removed.  Prior to Admission medications   Medication Sig Start Date End Date Taking? Authorizing Provider  Alum & Mag Hydroxide-Simeth (MAGIC MOUTHWASH W/LIDOCAINE) SOLN Take 5 mLs by mouth 4 (four) times daily as needed. 02/05/12   Shade Flood, MD  amLODipine (NORVASC) 10 MG tablet  Take 1 tablet (10 mg total) by mouth daily. 08/18/11   Elvina Sidle, MD  HYDROcodone-homatropine [facility]) 5-1.5 MG/5ML syrup 63m by mouth a bedtime as needed for cough. 02/05/12   Shade Flood, MD  hydroxychloroquine (PLAQUENIL) 200 MG tablet Take 200 mg by mouth 2 (two) times daily.    Historical Provider, MD  levofloxacin (LEVAQUIN) 500 MG tablet Take 1 tablet (500 mg total) by mouth daily. 02/05/12   Shade Flood, MD  olmesartan (BENICAR) 20 MG tablet Take 1 tablet (20 mg total) by mouth daily. 08/18/11 08/17/12  Elvina Sidle, MD  Vitamin D, Ergocalciferol, (DRISDOL) 50000 UNITS CAPS Take 50,000 Units by mouth.    Historical Provider, MD    No Known Allergies  family history includes Arthritis in her mother; Asthma in her paternal grandmother; Diabetes in her mother; Heart disease in her father; and Hypertension in her brother, father, and mother.   reports that she has never smoked. She has never used smokeless tobacco. She reports that she does not drink alcohol or use illicit drugs.   Physical Exam:  General - No distress ENT - No sinus tenderness, clear nasal discharge, no oral exudate, no LAN Cardiac - s1s2 regular, no murmur, pulses symmetric Chest - No wheeze/rales/dullness Back - No focal tenderness Abd - Soft, non-tender Ext - No edema Neuro - Normal strength Skin - No rashes Psych - Normal mood   Assessment/Plan:  Coralyn Helling, MD  Pulmonary/Critical Care/Sleep Pager:  [phone] 10/10/2012, 2:53 PM

## 2013-01-24 ENCOUNTER — Other Ambulatory Visit: Payer: Self-pay

## 2013-04-01 ENCOUNTER — Encounter: Payer: Self-pay | Admitting: *Deleted

## 2013-04-01 ENCOUNTER — Encounter: Payer: Federal, State, Local not specified - PPO | Attending: Family Medicine | Admitting: *Deleted

## 2013-04-01 VITALS — Ht 63.5 in | Wt 271.6 lb

## 2013-04-01 DIAGNOSIS — E669 Obesity, unspecified: Secondary | ICD-10-CM

## 2013-04-01 DIAGNOSIS — Z713 Dietary counseling and surveillance: Secondary | ICD-10-CM | POA: Insufficient documentation

## 2013-04-01 DIAGNOSIS — L680 Hirsutism: Secondary | ICD-10-CM | POA: Insufficient documentation

## 2013-04-01 NOTE — Progress Notes (Signed)
Medical Nutrition Therapy:  Appt start time: 0800 end time:  0900.  Assessment:  Primary concerns today: Elizabeth [name] is here for nutrition counseling pertaining to obesity.  She states that she lost about 40 pounds in her own and then she put that weight back on and then some.  She is frustrated because her weight goes up and down and she is worried about her health. She joined Weight [employer] last year and lost some weight, but she overeats on sweets.  She feels like she does better without eating sweets at all.  She would like to be able to lose and maintain that weight loss.   She also came to see a dietitian about 10 years ago and did well, but then that provider moved out of state.  Elizabeth [name] stopped seeing another RD after that.  Her highest weight as an adult is about 292 lb and her lowest weight as an adult was about 160 lb.  She would like to weigh under 200 pounds.  She presents today with hirsutism and there is a family history of diabetes.  She lives by herself.  While "dieting" she cooks more for herself at home.  Right now she's doing more drive-through, fast food stuff.  She is retired.  She admits to grazing throughout the day.  She rarely eats at the table and eats in front of the tv.  She feels like she is a fast eater.  Sometimes she skips meals.  She tries to bake her foods more often.  She eats vegetables maybe 2-3 days/week and sometimes eats more fruits  Preferred Learning Style:   Auditory  Learning Readiness:   Ready  MEDICATIONS: see list   DIETARY INTAKE:  Usual eating pattern includes 2-3 meals and ad lib snacks per day.  Everyday foods include fatty proteins, refined carbohydrates, sweets.  Avoided foods include none.    24-hr recall:  B ( AM): eggs with sausage. Doesn't care for bread.  Might have cereal or oatmeal with almonds.  Juices sometimes with the NutriBullet.  Might nto get breakfast if too busy Snk ( AM): grazes throughout the day on fruit, also likes  starches like cookies  L ( PM): salad with chicken.  Maybe chicken salad.  Might get burger from fast food.  Tries to limit the fries, but occasionally gets fries Snk ( PM): see above. grazes D ( PM): leftovers: meat and vegetable Snk ( PM): larger dessert Beverages: more water.  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  As evidenced by dietary recall.    Intervention:  Nutrition counseling provided.  Encouraged patient to reject traditional diet mentality of "good" vs "bad" foods.  There are no good and bad foods, but rather food is fuel that we needs for our bodies.  When we don't get enough fuel, our bodies suffer the metabolic consequences.  Encouraged patient to eat whatever foods will satisfy them, regardless of their nutritional value.  We will discuss nutritional values of foods at a subsequent appointment.  Encouraged patient to honor their body's internal hunger and fullness cues.  Throughout the day, check in mentally and rate hunger.  Try not to eat when ravenous, but instead when slightly hungry.  Then choose food(s) that will be satisfying  regardless of nutritional content.  Sit down to enjoy those foods.  Minimize distractions: turn off tv, put away books, work, Programmer, applications.  Make the meal last at least 20 minutes in order to give time to experience and register satiety.  Stop eating when full regardless of how much food is left on the plate.  Get more if still hungry.  The key is to honor fullness so throughout the meal, rate fullness factor and stop when comfortably full, but not stuffed.  Reminded patient that they can have any food they want, whenever they want, and however much they want.  Eventually the novelty will wear out and each  food will be equal in terms of its emotional appeal.  This will be a learning process and some days more food will be eaten, some days less.  The key is to honor hunger and fullness without any feelings of guilt.  Pay attention to what the internal cues are, rather than any external factors.  Teaching Method Utilized:  Visual Auditory  Handouts given during visit include:  Hunger Scale  Barriers to learning/adherence to lifestyle change: none  Demonstrated degree of understanding via:  Teach Back   Monitoring/Evaluation:  Dietary intake, exercise, and body weight in 4-6 week(s).

## 2013-04-01 NOTE — Patient Instructions (Signed)
Goals:  Set timer for every 3 hours.  Assess energy, hunger, thirst.  Then eat if you are hungry.  Don't if you're not Reject diet mentality- there are no good or bad foods Listen to internal hunger cues and honor those cues; don't wait until you're ravenous to eat Choose the food(s) you want Enjoy those foods.  Try to make meal last 20 minutes.  Avoid distractions.  No tv, books, magazine, etc Stop eating when full.  Honor fullness cues too Do these things without any guilt or regret

## 2013-05-13 ENCOUNTER — Encounter: Payer: Federal, State, Local not specified - PPO | Attending: Family Medicine | Admitting: *Deleted

## 2013-05-13 DIAGNOSIS — Z713 Dietary counseling and surveillance: Secondary | ICD-10-CM | POA: Insufficient documentation

## 2013-05-13 DIAGNOSIS — E669 Obesity, unspecified: Secondary | ICD-10-CM | POA: Insufficient documentation

## 2013-05-13 DIAGNOSIS — L680 Hirsutism: Secondary | ICD-10-CM | POA: Insufficient documentation

## 2013-05-13 NOTE — Progress Notes (Signed)
Chose to leave.  States she "wasn't in the right frame of mind"

## 2013-05-19 IMAGING — CR DG CHEST 2V
2 series · 2 of 2 positions shown · non-contrast
Comparison: None.

CLINICAL DATA: Cough, hoarseness

CHEST - 2 VIEW

[PA]
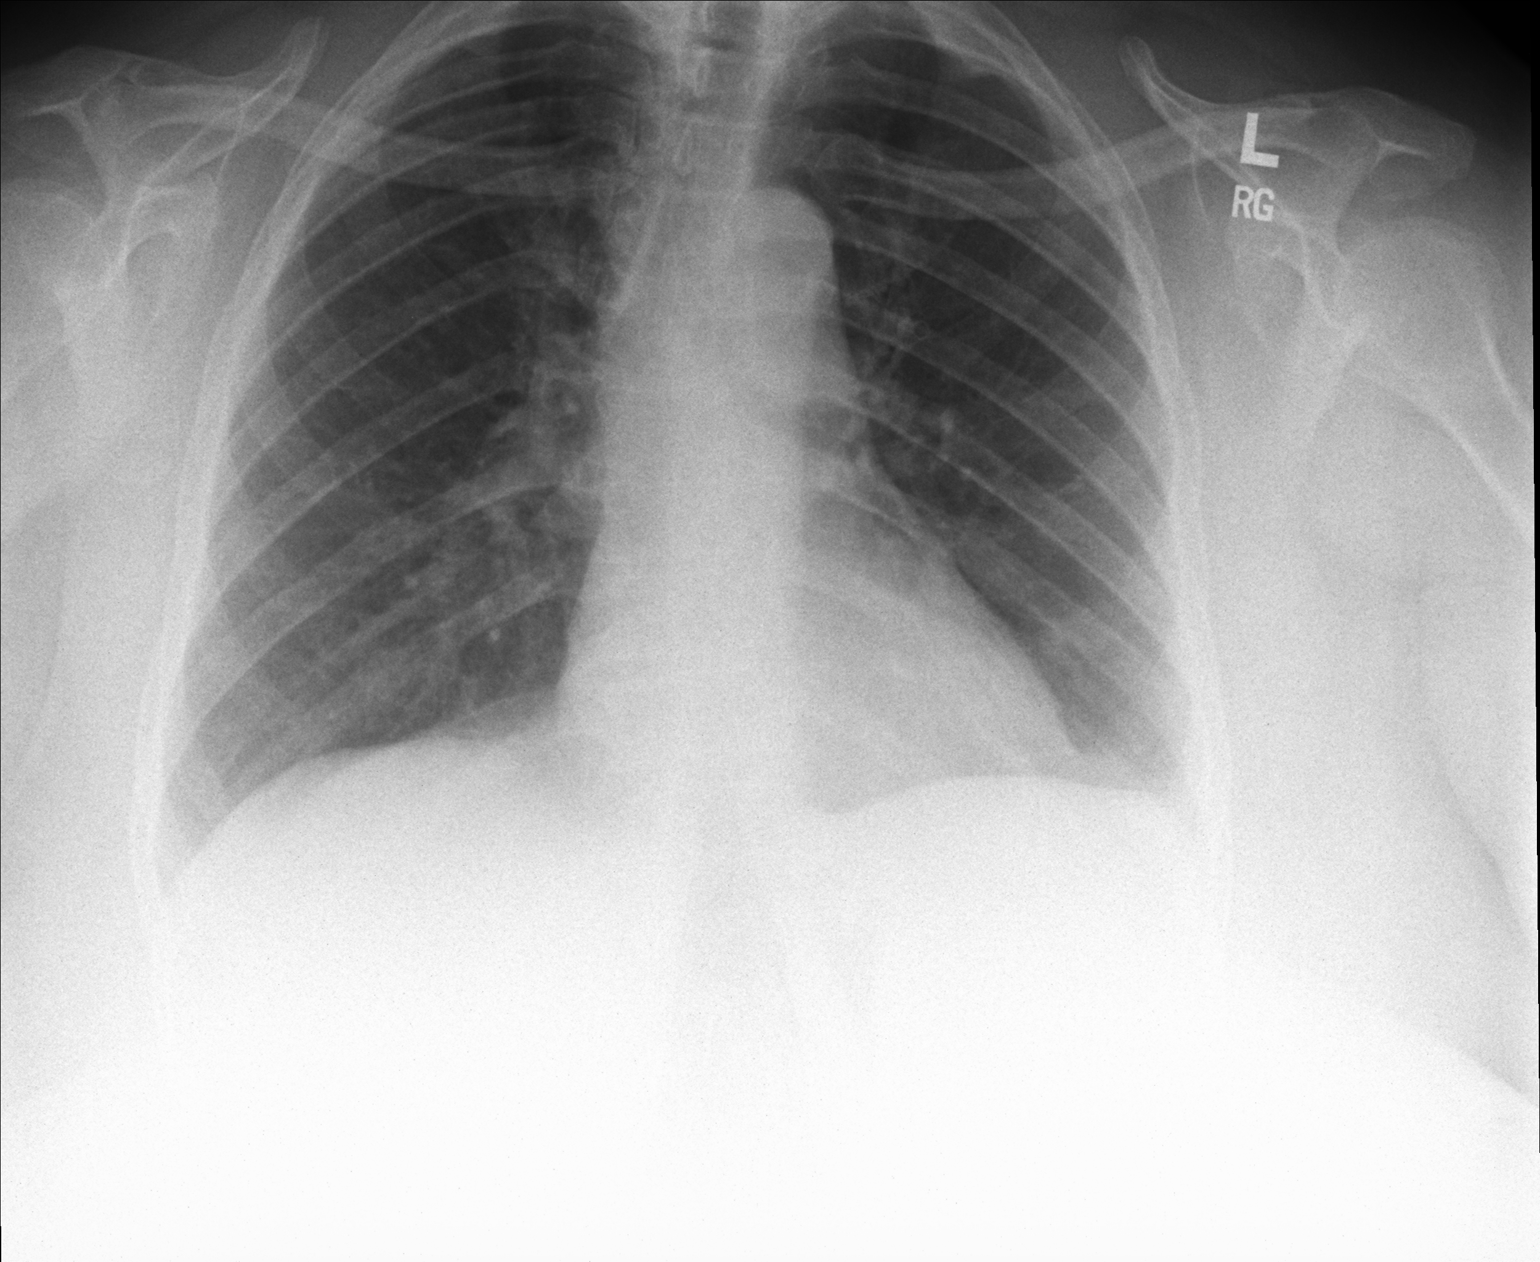

[lateral]
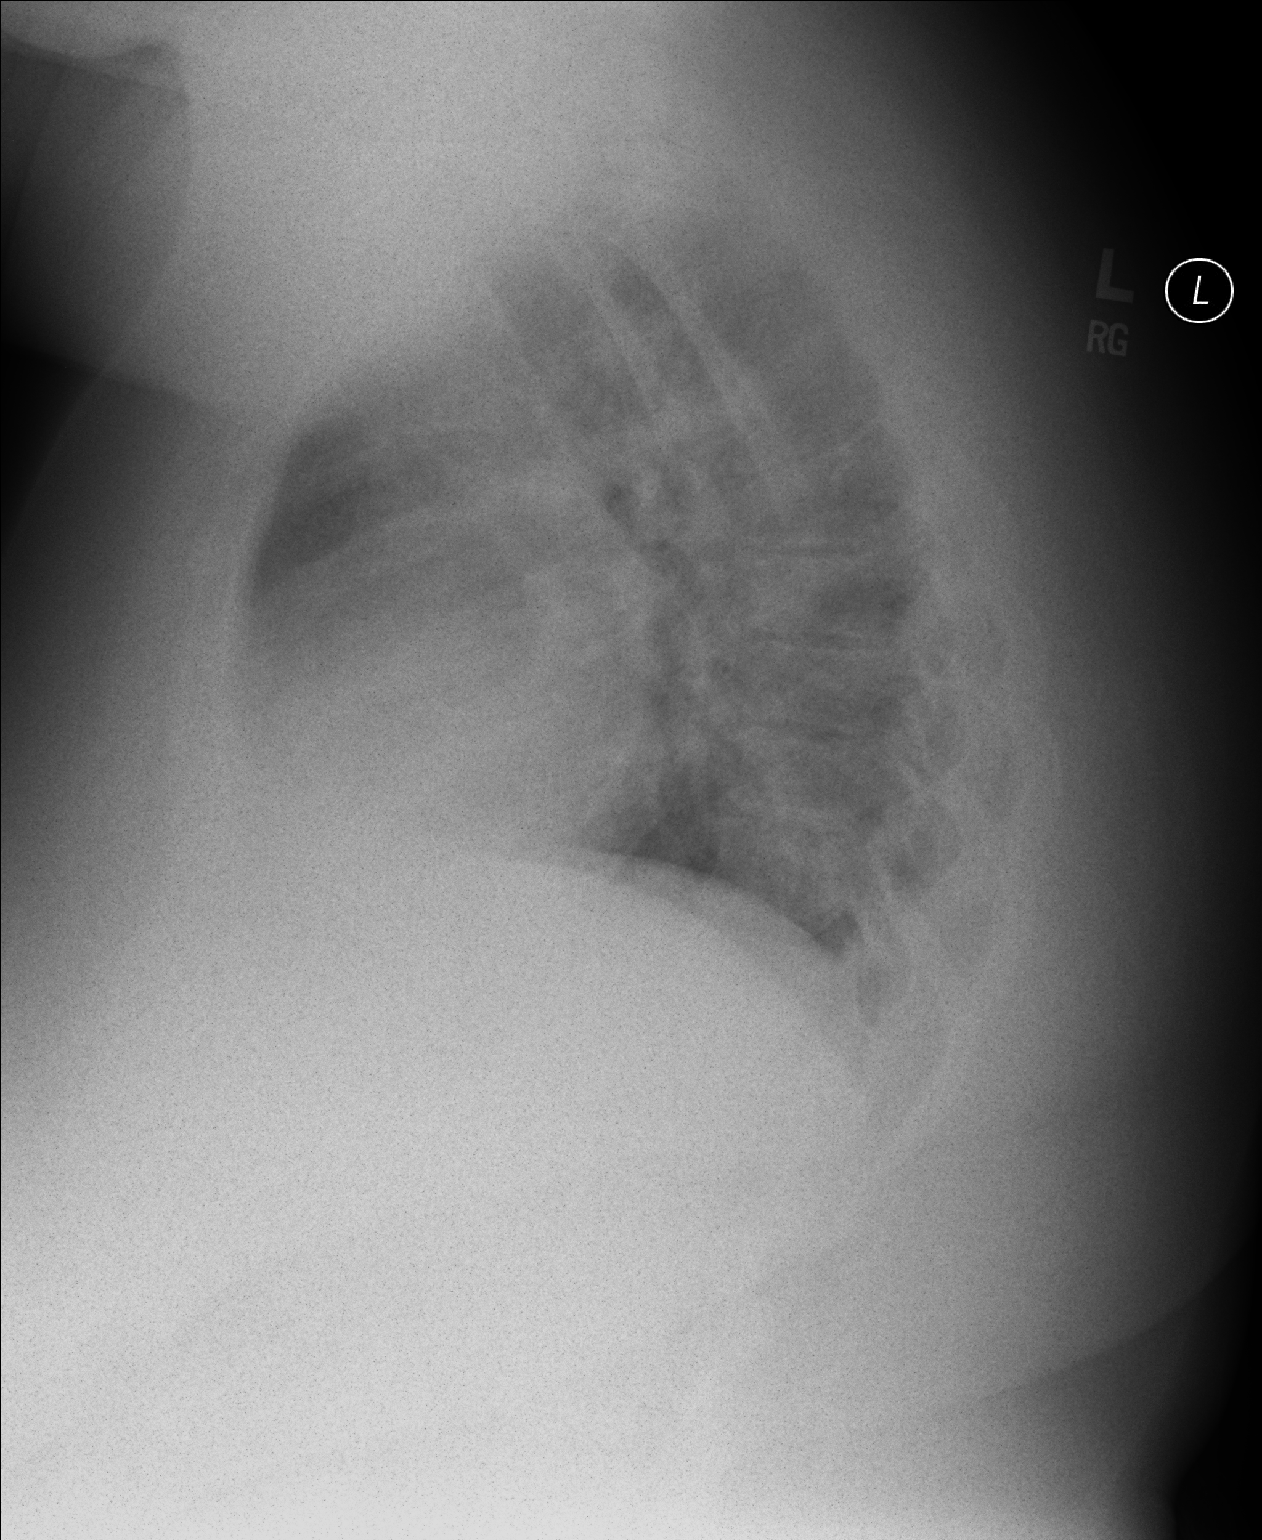

[2 of 2 positions shown; findings below may reference images not displayed]

FINDINGS: Lungs are essentially clear.  Possible minimal bibasilar
atelectasis.  No pleural effusion or pneumothorax.

Cardiomediastinal silhouette is within normal limits.

Mild degenerative changes of the visualized thoracolumbar spine.
IMPRESSION: No evidence of acute cardiopulmonary disease.

## 2013-06-04 ENCOUNTER — Encounter: Payer: Federal, State, Local not specified - PPO | Attending: Family Medicine | Admitting: Dietician

## 2013-06-04 VITALS — Ht 63.5 in | Wt 286.8 lb

## 2013-06-04 DIAGNOSIS — L680 Hirsutism: Secondary | ICD-10-CM | POA: Insufficient documentation

## 2013-06-04 DIAGNOSIS — E669 Obesity, unspecified: Secondary | ICD-10-CM | POA: Insufficient documentation

## 2013-06-04 DIAGNOSIS — Z713 Dietary counseling and surveillance: Secondary | ICD-10-CM | POA: Insufficient documentation

## 2013-06-04 NOTE — Progress Notes (Signed)
  Medical Nutrition Therapy:  Appt start time: 2671 end time:  2458.  Assessment:  Primary concerns today: Elizabeth [name] is here for a follow up for obesity. Previously met with Ozzie Hoyle, MS, RD, LDN. Has gained about 15 lbs in the past 2 months. States she has been eating a lot with the bad weather and hasn't been as active as she has been in the past and is not currently active. Used to walk 5 x a week at the mall and was going to [employer] and was down to 250 lbs. Plans to purchase a treadmill or other equipment for inside. Weight has fluctuated a lot over the years. Eats out a lot and not planning meals.   Wt Readings from Last 3 Encounters:  06/04/13 286 lb 12.8 oz (130.092 kg)  04/01/13 271 lb 9.6 oz (123.197 kg)  10/10/12 247 lb (112.038 kg)   Ht Readings from Last 3 Encounters:  06/04/13 5' 3.5" (1.613 m)  04/01/13 5' 3.5" (1.613 m)  10/10/12 5' 4.5" (1.638 m)   Body mass index is 50 kg/(m^2). $RemoveBefo'@BMIFA'vFniYmzWPMn$ @ Normalized weight-for-age data available only for age 21 to 33 years. Normalized stature-for-age data available only for age 21 to 16 years.   Preferred Learning Style:   Auditory  Learning Readiness:   Ready  MEDICATIONS: see list   DIETARY INTAKE:  Usual eating pattern includes 2-3 meals and ad lib snacks per day.  Everyday foods include fatty proteins, refined carbohydrates, sweets.  Avoided foods include none.    24-hr recall:  B ( AM): eggs with sausage. Doesn't care for bread.  Might have cereal or oatmeal with almonds.  Juices sometimes with the NutriBullet.  Might nto get breakfast if too busy Snk ( AM): grazes throughout the day on fruit, also likes starches like cookies  L ( PM): salad with chicken.  Maybe chicken salad.  Might get burger from fast food.  Tries to limit the fries, but occasionally gets fries Snk ( PM): see above. grazes D ( PM): leftovers: meat and vegetable Snk ( PM): larger dessert Beverages: more water.  Tries to limit sugary beverages    Usual physical activity: not at this time  Estimated energy needs: 1800 calories 200 g carbohydrates 135 g protein 50 g fat    Nutritional Diagnosis:  NB-1.5 Disordered eating pattern As related to meal skipping, chronic dieting, and mindless eating.  As evidenced by dietary recall.    Intervention:  Nutrition counseling provided.   Plan: Fill up half of your plate with vegetables (frozen, pre-washed). Consider using small plate and bowls to help with portion sizes. Take 20 minutes to eat meals (putting the fork down, drink water, chew 20 times). If you are hungry, have a snack with protein and carbohydrates (see list). Portion out snack foods ahead of time instead of eating out of the box. Consider buying smaller bananas.  Aim to walk 2-3 week for 15-20 minutes and increase when possible.  Continue drinking mostly water.  Think about writing down what you eat if it helps you to be accountable.    Teaching Method Utilized:  Visual Auditory  Handouts given during visit include:  MyPlate Handout  15 g CHO Snacks  Barriers to learning/adherence to lifestyle change: none  Demonstrated degree of understanding via:  Teach Back   Monitoring/Evaluation:  Dietary intake, exercise, and body weight in 4 week(s).

## 2013-06-04 NOTE — Patient Instructions (Signed)
Fill up half of your plate with vegetables (frozen, pre-washed). Consider using small plate and bowls to help with portion sizes. Take 20 minutes to eat meals (putting the fork down, drink water, chew 20 times). If you are hungry, have a snack with protein and carbohydrates (see list). Portion out snack foods ahead of time instead of eating out of the box. Consider buying smaller bananas.  Aim to walk 2-3 week for 15-20 minutes and increase when possible.  Continue drinking mostly water.  Think about writing down what you eat if it helps you to be accountable.

## 2013-07-08 ENCOUNTER — Ambulatory Visit: Payer: Federal, State, Local not specified - PPO | Admitting: Dietician

## 2013-07-23 ENCOUNTER — Encounter: Payer: Federal, State, Local not specified - PPO | Attending: Family Medicine | Admitting: Dietician

## 2013-07-23 VITALS — Ht 63.5 in | Wt 287.6 lb

## 2013-07-23 DIAGNOSIS — E669 Obesity, unspecified: Secondary | ICD-10-CM | POA: Insufficient documentation

## 2013-07-23 DIAGNOSIS — Z713 Dietary counseling and surveillance: Secondary | ICD-10-CM | POA: Insufficient documentation

## 2013-07-23 DIAGNOSIS — L680 Hirsutism: Secondary | ICD-10-CM | POA: Insufficient documentation

## 2013-07-23 NOTE — Patient Instructions (Signed)
Fill up half of your plate with vegetables (frozen, pre-washed). Consider using small plate and bowls to help with portion sizes. Aim to walk 2-3 week for 30 minutes and increase when possible.  Think about writing down what you eat if it helps you to be accountable.  Write out your eating plan for the week.  Think about cooking food and freezing it to have for future meals.  Try eating all meals and snacks at the table with no TV.

## 2013-07-23 NOTE — Progress Notes (Signed)
  Medical Nutrition Therapy:  Appt start time: 515 end time:  545.  Assessment:  Primary concerns today: Elizabeth [name] is here for a follow up for obesity with a 1 lb weight gain. States that she is having a hard time keeping on track eating right. Isn't sure why she can't lose weight. Had lost weight since her last appointment but had back pain and gained weight back since then. Once she has some sugary or starchy foods she "wants more" and has trouble not eating. Concerned about her joints and muscles with having extra weight.   Trying to cook more meals at home and less processed foods. Drinks mostly water now. Started back to walking at mall a couple of times.    Wt Readings from Last 3 Encounters:  07/23/13 287 lb 9.6 oz (130.455 kg)  06/04/13 286 lb 12.8 oz (130.092 kg)  04/01/13 271 lb 9.6 oz (123.197 kg)   Ht Readings from Last 3 Encounters:  07/23/13 5' 3.5" (1.613 m)  06/04/13 5' 3.5" (1.613 m)  04/01/13 5' 3.5" (1.613 m)   Body mass index is 50.14 kg/(m^2). @BMIFA @ Normalized weight-for-age data available only for age 77 to 20 years. Normalized stature-for-age data available only for age 77 to 20 years.   Preferred Learning Style:   Auditory  Learning Readiness:   Ready  MEDICATIONS: see list   DIETARY INTAKE:  Usual eating pattern includes 2-3 meals and ad lib snacks per day.  Everyday foods include fatty proteins, refined carbohydrates, sweets.  Avoided foods include none.    24-hr recall:  B ( AM): eggs with sausage. Doesn't care for bread.  Might have cereal or oatmeal with almonds.  Juices sometimes with the NutriBullet.  Might not get breakfast if too busy Snk ( AM): grazes throughout the day on fruit, also likes starches like cookies  L ( PM): salad with chicken.  Maybe chicken salad.  Might get burger from fast food.  Tries to limit the fries, but occasionally gets fries Snk ( PM): see above. grazes D ( PM): leftovers: meat and vegetable Snk ( PM): larger  dessert Beverages: more water  Usual physical activity: walking at the mall early in the morning  Estimated energy needs: 1800 calories 200 g carbohydrates 135 g protein 50 g fat    Nutritional Diagnosis:  NB-1.5 Disordered eating pattern As related to meal skipping, chronic dieting, and mindless eating.  As evidenced by dietary recall.    Intervention:  Nutrition counseling provided.   Plan: Fill up half of your plate with vegetables (frozen, pre-washed). Consider using small plate and bowls to help with portion sizes. Aim to walk 2-3 week for 30 minutes and increase when possible.  Think about writing down what you eat if it helps you to be accountable.  Write out your eating plan for the week.  Think about cooking food and freezing it to have for future meals.  Try eating all meals and snacks at the table with no TV.    Teaching Method Utilized:  Visual Auditory  Barriers to learning/adherence to lifestyle change: none  Demonstrated degree of understanding via:  Teach Back   Monitoring/Evaluation:  Dietary intake, exercise, and body weight in 8 week(s).

## 2013-08-30 ENCOUNTER — Ambulatory Visit (INDEPENDENT_AMBULATORY_CARE_PROVIDER_SITE_OTHER): Payer: Federal, State, Local not specified - PPO | Admitting: Pulmonary Disease

## 2013-08-30 ENCOUNTER — Encounter: Payer: Self-pay | Admitting: Pulmonary Disease

## 2013-08-30 ENCOUNTER — Ambulatory Visit (INDEPENDENT_AMBULATORY_CARE_PROVIDER_SITE_OTHER)
Admission: RE | Admit: 2013-08-30 | Discharge: 2013-08-30 | Disposition: A | Discharge status: Home / Self Care | Payer: Federal, State, Local not specified - PPO | Source: Ambulatory Visit | Attending: Pulmonary Disease | Admitting: Pulmonary Disease

## 2013-08-30 VITALS — BP 134/76 | HR 60 | Ht 64.5 in | Wt 292.0 lb

## 2013-08-30 DIAGNOSIS — R0989 Other specified symptoms and signs involving the circulatory and respiratory systems: Secondary | ICD-10-CM

## 2013-08-30 DIAGNOSIS — R059 Cough, unspecified: Secondary | ICD-10-CM

## 2013-08-30 DIAGNOSIS — R0609 Other forms of dyspnea: Secondary | ICD-10-CM

## 2013-08-30 DIAGNOSIS — R05 Cough: Secondary | ICD-10-CM

## 2013-08-30 DIAGNOSIS — R0600 Dyspnea, unspecified: Secondary | ICD-10-CM

## 2013-08-30 NOTE — Patient Instructions (Signed)
Chest xray today  Follow up in 6 months 

## 2013-08-30 NOTE — Progress Notes (Signed)
Chief Complaint  Patient presents with  . Follow-up    Pt's SOB had improved, but caught a cold X3 weeks ago and SOB has worsened.      History of Present Illness: Elizabeth [name] is a 63 y.o. female with dyspnea with history of connective tissue disease.  She developed a cold about 3 weeks ago.  This started in her sinuses.  She has having more trouble with her breathing, but this has improved some.  She has regained weight.  She has not had recent chest xray.  She was having sore throat, runny nose, cough and sinus pressure.  She was treated with mucinex and amoxicillin >> these didn't help.  She was then treated with levaquin, and this helped.  She has also been using nasal irrigation and flonase.  She was feeling feverish, but this has resolved.  She also had pneumonia about 6 or 7 months ago.  Tests: Lexiscan myocardial perfusion study 08/26/11 >> no ischemia PSG 08/28/11 >> AHI 10.4, SpO2 low 82% Echo 08/31/11 >> EF 60%, mild/mod LVH Labs 01/13/12 >> ANA >1:2560, Smith Ab 249, Sm/RNP 155 CT chest 05/31/12 >> b/l axillary nodes up to 1.7 cm on Rt, subtle sub-pleural reticulation b/l periphery of lower lobes, minimal air-trapping, hepatic steatosis PFT 06/20/12 >> FEV1 1.45 (68%), FEV1% 82, TLC 4.14 (80%), DLCO 51%, no BD Labs 08/02/12 >> ESR 55, ANA >1:2560 6MWT 10/10/12 >> 450 meters  Elizabeth [name]  has a past medical history of Abnormal EKG; Anemia; Hypertension; Sjogren's syndrome (03/22/2011); and OSA (obstructive sleep apnea).  Elizabeth [name]  has past surgical history that includes Cholecystectomy; Abdominal hysterectomy; and paratoid tumor removed.  Prior to Admission medications   Medication Sig Start Date End Date Taking? Authorizing Provider  Alum & Mag Hydroxide-Simeth (MAGIC MOUTHWASH W/LIDOCAINE) SOLN Take 5 mLs by mouth 4 (four) times daily as needed. 02/05/12   Wendie Agreste, MD  amLODipine (NORVASC) 10 MG tablet Take 1 tablet (10 mg total) by mouth daily. 08/18/11    Robyn Haber, MD  HYDROcodone-homatropine [facility]) 5-1.5 MG/5ML syrup 37mby mouth a bedtime as needed for cough. 02/05/12   JWendie Agreste MD  hydroxychloroquine (PLAQUENIL) 200 MG tablet Take 200 mg by mouth 2 (two) times daily.    Historical Provider, MD  levofloxacin (LEVAQUIN) 500 MG tablet Take 1 tablet (500 mg total) by mouth daily. 02/05/12   JWendie Agreste MD  olmesartan (BENICAR) 20 MG tablet Take 1 tablet (20 mg total) by mouth daily. 08/18/11 08/17/12  KRobyn Haber MD  Vitamin D, Ergocalciferol, (DRISDOL) 50000 UNITS CAPS Take 50,000 Units by mouth.    Historical Provider, MD    Allergies  Allergen Reactions  . Food     strawberries    family history includes Arthritis in her mother; Asthma in her paternal grandmother; Diabetes in her mother; Heart disease in her father; Hypertension in her brother, father, and mother.   reports that she has never smoked. She has never used smokeless tobacco. She reports that she does not drink alcohol or use illicit drugs.   Physical Exam:  General - No distress ENT - No sinus tenderness, clear nasal discharge, no oral exudate, no LAN Cardiac - s1s2 regular, no murmur, pulses symmetric Chest - No wheeze/rales/dullness Back - No focal tenderness Abd - Soft, non-tender Ext - No edema Neuro - Normal strength Skin - No rashes Psych - Normal mood   Assessment/Plan:  VChesley Mires MD [place] [facility]/Critical Care/Sleep Pager:  [phone]  08/30/2013, 4:20 PM

## 2013-09-04 NOTE — Assessment & Plan Note (Signed)
Some of her current symptoms are likely related to recent respiratory infection.  Will repeat chest xray and call her with results.

## 2013-09-06 ENCOUNTER — Telehealth: Payer: Self-pay | Admitting: Pulmonary Disease

## 2013-09-06 DIAGNOSIS — R05 Cough: Secondary | ICD-10-CM

## 2013-09-06 DIAGNOSIS — R059 Cough, unspecified: Secondary | ICD-10-CM

## 2013-09-06 DIAGNOSIS — R918 Other nonspecific abnormal finding of lung field: Secondary | ICD-10-CM

## 2013-09-06 DIAGNOSIS — M3500 Sicca syndrome, unspecified: Secondary | ICD-10-CM

## 2013-09-06 NOTE — Telephone Encounter (Signed)
Dg Chest 2 View  08/30/2013   CLINICAL DATA:  Cough, history of Sjogren's disease, shortness of breath  EXAM: CHEST  2 VIEW  COMPARISON:  Chest x-ray of 08/09/2012  FINDINGS: No active infiltrate or effusion is seen. However, there is more opacity which appears pleural base within the left upper lobe to the apex. A developing neoplasm cannot be excluded and CT of the chest is recommended. Mediastinal and hilar contours appear stable. The heart is borderline enlarged and stable. No acute bony abnormality is seen.  IMPRESSION: 1. Increase in pleural-based opacity in the left lung apex. Recommend CT of the chest to exclude neoplasm. 2. No active infiltrate or effusion.   Electronically Signed   By: Dwyane DeePaul  Barry M.D.   On: 08/30/2013 17:02    Results d/w pt.  Will arrange for CT chest with IV contrast to further evaluate.

## 2013-09-11 ENCOUNTER — Ambulatory Visit (INDEPENDENT_AMBULATORY_CARE_PROVIDER_SITE_OTHER)
Admission: RE | Admit: 2013-09-11 | Discharge: 2013-09-11 | Disposition: A | Discharge status: Home / Self Care | Payer: Federal, State, Local not specified - PPO | Source: Ambulatory Visit | Attending: Pulmonary Disease | Admitting: Pulmonary Disease

## 2013-09-11 DIAGNOSIS — M3500 Sicca syndrome, unspecified: Secondary | ICD-10-CM

## 2013-09-11 DIAGNOSIS — R05 Cough: Secondary | ICD-10-CM

## 2013-09-11 DIAGNOSIS — R918 Other nonspecific abnormal finding of lung field: Secondary | ICD-10-CM

## 2013-09-11 DIAGNOSIS — R059 Cough, unspecified: Secondary | ICD-10-CM

## 2013-09-11 MED ORDER — IOHEXOL 300 MG/ML  SOLN
80.0000 mL | Freq: Once | INTRAMUSCULAR | Status: AC | PRN
  Administered 2013-09-11: 80 mL via INTRAVENOUS

## 2013-09-13 ENCOUNTER — Telehealth: Payer: Self-pay | Admitting: Pulmonary Disease

## 2013-09-13 NOTE — Telephone Encounter (Signed)
Results have been explained to patient, pt expressed understanding. Nothing further needed.  

## 2013-09-13 NOTE — Telephone Encounter (Signed)
Ct Chest W Contrast  09/11/2013    CLINICAL DATA:  Sjogren's syndrome. Left upper lung pleural-based lesion seen on recent chest x-ray.   EXAM: CT CHEST WITH CONTRAST   TECHNIQUE: Multidetector CT imaging of the chest was performed during intravenous contrast administration.   CONTRAST:  80mL OMNIPAQUE IOHEXOL 300 MG/ML  SOLN   COMPARISON:  Chest x-ray from 08/30/2013.  CT chest from 05/31/2012.   FINDINGS:  Soft tissue / Mediastinum:  Upper normal to borderline enlarged lymph nodes are seen in both axillary regions and the subpectoral spaces. There is no mediastinal or hilar lymphadenopathy. The heart size is normal. No pericardial effusion. The thoracic esophagus is normal in appearance.   Lungs / Pleura:  Lungs are clear bilaterally. No pulmonary parenchymal nodule or mass. No focal airspace consolidation. Specifically, there is no pleural lesion in the left upper hemi thorax. The subtle subpleural reticulation along the periphery of the lower lobes seen on the previous high-resolution CT is not readily evident on today's standard evaluation of the chest.   Bones:  Bone windows reveal no worrisome lytic or sclerotic osseous lesions.   Upper Abdomen:   Unremarkable.   IMPRESSION:  No pleural-based lesion in the upper left chest. The patient does have somewhat prominent extrapleural fat between the first and second as well the second and third ribs, likely contributing to the opacity seen on the chest x-ray.    Electronically Signed   By: Kennith CenterEric  Mansell M.D.   On: 09/11/2013 11:57    Will have my nurse inform pt that CT chest was normal.  Changes on chest xray were related to overlaying skin shadows.  No additional assessment needed.

## 2013-09-24 ENCOUNTER — Ambulatory Visit: Payer: Federal, State, Local not specified - PPO | Admitting: Dietician

## 2013-12-10 ENCOUNTER — Other Ambulatory Visit: Payer: Self-pay

## 2013-12-10 DIAGNOSIS — Z1231 Encounter for screening mammogram for malignant neoplasm of breast: Secondary | ICD-10-CM

## 2013-12-18 ENCOUNTER — Other Ambulatory Visit: Payer: Self-pay

## 2013-12-18 ENCOUNTER — Ambulatory Visit
Admission: RE | Admit: 2013-12-18 | Discharge: 2013-12-18 | Disposition: A | Discharge status: Home / Self Care | Payer: Federal, State, Local not specified - PPO | Source: Ambulatory Visit

## 2013-12-18 DIAGNOSIS — Z1231 Encounter for screening mammogram for malignant neoplasm of breast: Secondary | ICD-10-CM

## 2014-02-26 ENCOUNTER — Telehealth: Payer: Self-pay | Admitting: Neurology

## 2014-02-26 NOTE — Telephone Encounter (Signed)
We moved patient appt from 04-10-14 to 02-27-14

## 2014-02-27 ENCOUNTER — Ambulatory Visit (INDEPENDENT_AMBULATORY_CARE_PROVIDER_SITE_OTHER): Payer: Federal, State, Local not specified - PPO | Admitting: Neurology

## 2014-02-27 ENCOUNTER — Other Ambulatory Visit: Payer: Self-pay | Admitting: *Deleted

## 2014-02-27 DIAGNOSIS — G5603 Carpal tunnel syndrome, bilateral upper limbs: Secondary | ICD-10-CM

## 2014-02-27 DIAGNOSIS — G5601 Carpal tunnel syndrome, right upper limb: Secondary | ICD-10-CM

## 2014-02-27 DIAGNOSIS — M79642 Pain in left hand: Secondary | ICD-10-CM

## 2014-02-27 DIAGNOSIS — M79641 Pain in right hand: Secondary | ICD-10-CM

## 2014-02-27 DIAGNOSIS — G5602 Carpal tunnel syndrome, left upper limb: Secondary | ICD-10-CM

## 2014-02-27 NOTE — Procedures (Signed)
[facility]  [address], [address]  [place], [place] [address] Tel: [phone] Fax:  [phone] Test Date:  02/27/2014  Patient: Elizabeth ShockMarchia Decarolis DOB: 11/04/1950 Physician: Nita Sickleonika Patel, DO  Sex: Female Height: 5\' 5"  Ref Phys: Laurann MontanaWhite, Cynthia  ID#: [id number] Temp: 35.0C Technician: Ala BentSusan Reid R. NCS T.   Patient Complaints: Patient is a 63 year old female here for evaluation of bilateral upper extremities due to symptoms of tingling, pain and grip weakness, worse on the right.  NCV & EMG Findings: Extensive electrodiagnostic testing of the right upper extremity and additional studies of the left shows:  1. Bilateral median sensory responses are prolonged and amplitude is reduced on the right side. Bilateral ulnar and radial sensory responses are within normal limits. 2. Bilateral median motor responses are prolonged, with preserved amplitude. Bilateral ulnar motor responses are within normal limits. 3. There is no evidence of active or chronic motor axon loss changes affecting any of the tested muscles. Motor unit recruitment and configuration is within normal limits.   Impression: 1. Bilateral median neuropathy at or distal to the wrist, consistent with the clinical diagnosis of carpal tunnel syndrome. Overall, these findings are moderate in degree electrically and worse on the right. 2. There is no evidence of a cervical radiculopathy affecting the upper extremities.    ___________________________ Nita Sickleonika Patel, DO    Nerve Conduction Studies Anti Sensory Summary Table   Site NR Peak (ms) Norm Peak (ms) P-T Amp (V) Norm P-T Amp  Left Median Anti Sensory (2nd Digit)  35C  Wrist    4.7 <3.8 10.7 >10  Right Median Anti Sensory (2nd Digit)  35C  Wrist    5.1 <3.8 6.7 >10  Site 2    5.4  6.6   Left Radial Anti Sensory (Base 1st Digit)  35C  Wrist    2.5 <2.8 17.8 >10  Right Radial Anti Sensory (Base 1st Digit)  35C  Wrist    2.2 <2.8 19.8 >10  Left Ulnar  Anti Sensory (5th Digit)  35C  Wrist    2.4 <3.2 18.4 >5  Right Ulnar Anti Sensory (5th Digit)  35C  Wrist    2.5 <3.2 12.2 >5   Motor Summary Table   Site NR Onset (ms) Norm Onset (ms) O-P Amp (mV) Norm O-P Amp Site1 Site2 Delta-0 (ms) Dist (cm) Vel (m/s) Norm Vel (m/s)  Left Median Motor (Abd Poll Brev)  35C  Wrist    6.1 <4.0 12.5 >5 Elbow Wrist 4.2 22.0 52 >50  Elbow    10.3  11.1         Right Median Motor (Abd Poll Brev)  35C  Wrist    5.7 <4.0 11.0 >5 Elbow Wrist 4.1 22.0 54 >50  Elbow    9.8  10.6         Left Ulnar Motor (Abd Dig Minimi)  35C  Wrist    1.5 <3.1 9.8 >7 B Elbow Wrist 3.9 22.0 56 >50  B Elbow    5.4  9.2  A Elbow B Elbow 1.6 10.0 63 >50  A Elbow    7.0  9.0         Right Ulnar Motor (Abd Dig Minimi)  35C  Wrist    1.3 <3.1 10.0 >7 B Elbow Wrist 4.2 21.0 50 >50  B Elbow    5.5  9.2  A Elbow B Elbow 1.5 10.0 67 >50  A Elbow    7.0  9.0  EMG   Side Muscle Ins Act Fibs Psw Fasc Number Recrt Dur Dur. Amp Amp. Poly Poly. Comment  Left 1stDorInt Nml Nml Nml Nml Nml Nml Nml Nml Nml Nml Nml Nml N/A  Left Ext Indicis Nml Nml Nml Nml Nml Nml Nml Nml Nml Nml Nml Nml N/A  Left PronatorTeres Nml Nml Nml Nml Nml Nml Nml Nml Nml Nml Nml Nml N/A  Left Abd Poll Brev Nml Nml Nml Nml Nml Nml Nml Nml Nml Nml Nml Nml N/A  Left Triceps Nml Nml Nml Nml Nml Nml Nml Nml Nml Nml Nml Nml N/A  Left Biceps Nml Nml Nml Nml Nml Nml Nml Nml Nml Nml Nml Nml N/A  Right 1stDorInt Nml Nml Nml Nml Nml Nml Nml Nml Nml Nml Nml Nml N/A  Right Abd Poll Brev Nml Nml Nml Nml Nml Nml Nml Nml Nml Nml Nml Nml N/A  Right Ext Indicis Nml Nml Nml Nml Nml Nml Nml Nml Nml Nml Nml Nml N/A  Right PronatorTeres Nml Nml Nml Nml Nml Nml Nml Nml Nml Nml Nml Nml N/A  Right Biceps Nml Nml Nml Nml Nml Nml Nml Nml Nml Nml Nml Nml N/A  Right Triceps Nml Nml Nml Nml Nml Nml Nml Nml Nml Nml Nml Nml N/A      Waveforms:

## 2014-02-28 ENCOUNTER — Other Ambulatory Visit: Payer: Self-pay | Admitting: *Deleted

## 2014-02-28 DIAGNOSIS — M79642 Pain in left hand: Secondary | ICD-10-CM

## 2014-02-28 DIAGNOSIS — M79641 Pain in right hand: Secondary | ICD-10-CM

## 2014-04-10 ENCOUNTER — Encounter: Payer: Federal, State, Local not specified - PPO | Admitting: Neurology

## 2014-06-30 ENCOUNTER — Ambulatory Visit
Admission: RE | Admit: 2014-06-30 | Discharge: 2014-06-30 | Disposition: A | Discharge status: Home / Self Care | Payer: Federal, State, Local not specified - PPO | Source: Ambulatory Visit | Attending: Family Medicine | Admitting: Family Medicine

## 2014-06-30 ENCOUNTER — Other Ambulatory Visit: Payer: Self-pay | Admitting: Family Medicine

## 2014-06-30 DIAGNOSIS — R1011 Right upper quadrant pain: Secondary | ICD-10-CM

## 2014-08-20 ENCOUNTER — Ambulatory Visit (INDEPENDENT_AMBULATORY_CARE_PROVIDER_SITE_OTHER): Payer: Federal, State, Local not specified - PPO | Admitting: Pulmonary Disease

## 2014-08-20 ENCOUNTER — Encounter: Payer: Self-pay | Admitting: Pulmonary Disease

## 2014-08-20 VITALS — BP 122/86 | HR 66 | Ht 63.5 in | Wt 263.0 lb

## 2014-08-20 DIAGNOSIS — R918 Other nonspecific abnormal finding of lung field: Secondary | ICD-10-CM | POA: Diagnosis not present

## 2014-08-20 DIAGNOSIS — R059 Cough, unspecified: Secondary | ICD-10-CM

## 2014-08-20 DIAGNOSIS — R05 Cough: Secondary | ICD-10-CM

## 2014-08-20 NOTE — Patient Instructions (Signed)
Can try using albuterol two puffs every 4 to 6 hours as needed for cough, wheeze, or chest congestion Call if you need a new inhaler Will schedule CT chest for October 2016 and schedule follow up after that

## 2014-08-20 NOTE — Progress Notes (Signed)
Chief Complaint  Patient presents with  . Follow-up    Recent CT scan showed bilateral nodules in Lower Lobe. No increased SOB noted.     History of Present Illness: Elizabeth [name] is a 64 y.o. female with dyspnea with history of connective tissue disease.  She had CT abd/pelvis in April to assess abdominal pain.  This incidentally showed 5 mm nodule LLL and 4 mm nodule RLL.  These were not seen on previous tests.  She has notice more sinus congestion and post nasal drip.  She gets a cough and chest congestion.  She feels like she can't get her full air into her chest.  She has used albuterol before, but not recently.  Tests: Lexiscan myocardial perfusion study 08/26/11 >> no ischemia PSG 08/28/11 >> AHI 10.4, SpO2 low 82% Echo 08/31/11 >> EF 60%, mild/mod LVH Labs 01/13/12 >> ANA >1:2560, Smith Ab 249, Sm/RNP 155 CT chest 05/31/12 >> b/l axillary nodes up to 1.7 cm on Rt, subtle sub-pleural reticulation b/l periphery of lower lobes, minimal air-trapping, hepatic steatosis PFT 06/20/12 >> FEV1 1.45 (68%), FEV1% 82, TLC 4.14 (80%), DLCO 51%, no BD Labs 08/02/12 >> ESR 55, ANA >1:2560 6MWT 10/10/12 >> 450 meters CT chest 08/13/13 >> no ASD  PMHx >> HTN, Sjogren's syndrome  PSHx, Medications, Allergies, Fhx, Shx reviewed.  Physical Exam: Blood pressure 122/86, pulse 66, height 5' 3.5" (1.613 m), weight 263 lb (119.296 kg), SpO2 96 %. Body mass index is 45.85 kg/(m^2).  General - No distress ENT - No sinus tenderness, clear nasal discharge, no oral exudate, no LAN Cardiac - s1s2 regular, no murmur, pulses symmetric Chest - No wheeze/rales/dullness Back - No focal tenderness Abd - Soft, non-tender Ext - No edema Neuro - Normal strength Skin - No rashes Psych - Normal mood   Assessment/Plan:  Pulmonary nodules. Plan: - will repeat CT chest w/o contrast in October 2016  Cough. Concerned this is related to allergies, rhinitis, and possible asthma. Plan: - continue prn  claritin - will have her try using albuterol prn >> she is to call back if this helps and she needs maintenance inhaler therapy   Chesley Mires, MD [place] [facility]/Critical Care/Sleep Pager:  [phone] 08/20/2014, 2:30 PM

## 2015-01-07 ENCOUNTER — Telehealth: Payer: Self-pay | Admitting: Pulmonary Disease

## 2015-01-07 NOTE — Telephone Encounter (Signed)
Spoke with pt. Offered her an appointment with TP, she declined. Would like to wait to see VS in January. Recall will be placed. Nothing further was needed.

## 2015-01-14 ENCOUNTER — Inpatient Hospital Stay: Admission: RE | Admit: 2015-01-14 | Payer: Federal, State, Local not specified - PPO | Source: Ambulatory Visit

## 2015-01-14 ENCOUNTER — Ambulatory Visit (INDEPENDENT_AMBULATORY_CARE_PROVIDER_SITE_OTHER)
Admission: RE | Admit: 2015-01-14 | Discharge: 2015-01-14 | Disposition: A | Discharge status: Home / Self Care | Payer: Federal, State, Local not specified - PPO | Source: Ambulatory Visit | Attending: Pulmonary Disease | Admitting: Pulmonary Disease

## 2015-01-14 DIAGNOSIS — R918 Other nonspecific abnormal finding of lung field: Secondary | ICD-10-CM

## 2015-01-26 ENCOUNTER — Telehealth: Payer: Self-pay | Admitting: Pulmonary Disease

## 2015-01-26 NOTE — Telephone Encounter (Signed)
CT chest 01/14/15 >> nodules from 06/30/14 resolved, stable borderline LAN   Will have my nurse inform pt that CT chest shows that lung nodules have resolved.  No other worrisome findings.  No need for additional follow up CT chest scans.  Please schedule follow up to review status of cough.

## 2015-01-26 NOTE — Telephone Encounter (Signed)
lmtcb

## 2015-02-26 ENCOUNTER — Telehealth: Payer: Self-pay | Admitting: Pulmonary Disease

## 2015-02-26 NOTE — Telephone Encounter (Signed)
Last f/u with phone note from 01/26/15.  Will route back to my nurse to f/u with patient about CT results and scheduling ROV.

## 2015-02-26 NOTE — Telephone Encounter (Signed)
Entered in error

## 2015-02-27 NOTE — Telephone Encounter (Signed)
Called home number- unable to leave voicemail - automated message stated call unavailable at this time.  Called mobile number, LMTCB x 1

## 2015-03-02 NOTE — Telephone Encounter (Signed)
Pt is aware of CT results. She had no questions.  Pt has a f/u already scheduled in 04/2015 with Dr. Craige CottaSood. Nothing further needed

## 2015-05-08 ENCOUNTER — Encounter: Payer: Self-pay | Admitting: Pulmonary Disease

## 2015-05-08 ENCOUNTER — Ambulatory Visit (INDEPENDENT_AMBULATORY_CARE_PROVIDER_SITE_OTHER): Payer: Federal, State, Local not specified - PPO | Admitting: Pulmonary Disease

## 2015-05-08 VITALS — BP 132/70 | HR 101 | Ht 63.5 in | Wt 241.0 lb

## 2015-05-08 DIAGNOSIS — R053 Chronic cough: Secondary | ICD-10-CM

## 2015-05-08 DIAGNOSIS — R05 Cough: Secondary | ICD-10-CM

## 2015-05-08 DIAGNOSIS — M3500 Sicca syndrome, unspecified: Secondary | ICD-10-CM

## 2015-05-08 DIAGNOSIS — R768 Other specified abnormal immunological findings in serum: Secondary | ICD-10-CM | POA: Diagnosis not present

## 2015-05-08 NOTE — Progress Notes (Signed)
Current Outpatient Prescriptions on File Prior to Visit  Medication Sig  . albuterol (PROVENTIL HFA;VENTOLIN HFA) 108 (90 BASE) MCG/ACT inhaler Inhale 1-2 puffs into the lungs every 6 (six) hours as needed for wheezing or shortness of breath.  Marland Kitchen amLODipine (NORVASC) 10 MG tablet Take 10 mg by mouth daily. Reported on 05/08/2015   No current facility-administered medications on file prior to visit.     Chief Complaint  Patient presents with  . Follow-up    pt. states breathing has improved. denies SOB, cough, wheezing or chest pain/tightness. increased fatigue.     Tests Lexiscan myocardial perfusion study 08/26/11 >> no ischemia PSG 08/28/11 >> AHI 10.4, SpO2 low 82% Echo 08/31/11 >> EF 60%, mild/mod LVH Labs 01/13/12 >> ANA >1:2560, Smith Ab 249, Sm/RNP 155 CT chest 05/31/12 >> b/l axillary nodes up to 1.7 cm on Rt, subtle sub-pleural reticulation b/l periphery of lower lobes, minimal air-trapping, hepatic steatosis PFT 06/20/12 >> FEV1 1.45 (68%), FEV1% 82, TLC 4.14 (80%), DLCO 51%, no BD Labs 08/02/12 >> ESR 55, ANA >1:2560 6MWT 10/10/12 >> 450 meters CT chest 08/13/13 >> no ASD CT chest 01/14/15 >> nodules from 06/30/14 resolved, stable borderline LAN Serology 04/03/15 >> ESR 86, RNP 5.2, ANA 1:1280, Anti DS DNA negative, SSA 7.1, SSB negative  Past medical hx HTN, Sjogren's syndrome  Past surgical hx, Allergies, Family hx, Social hx all reviewed.  Vital Signs BP 132/70 mmHg  Pulse 101  Ht 5' 3.5" (1.613 m)  Wt 241 lb (109.317 kg)  BMI 42.02 kg/m2  SpO2 96%  History of Present Illness Elizabeth [name] is a 65 y.o. female with hx of chronic cough, Sjogren's and possible lupus.  She is followed by Dr. Estanislado Pandy with rheumatology.  She started getting more joint pain/swelling >> involving MCP joints and knees.  Serology showed elevation in SSA, ANA, and ESR.  She feels tired.  She has low grade temp intermittently up to 100F.  She denies chest pain, cough, sputum, wheeze, or  hemoptysis.  She has not needed to use albuterol.  She feels her breathing has been okay.  She was started on plaquenil, and then most recently on imuran.   Physical Exam  General - No distress ENT - No sinus tenderness, no oral exudate, no LAN Cardiac - s1s2 regular, no murmur Chest - No wheeze/rales/dullness Back - No focal tenderness Abd - Soft, non-tender Ext - No edema Neuro - Normal strength Skin - No rashes Psych - normal mood, and behavior   Assessment/Plan  Chronic cough. Plan: - prn albuterol  Sjogren's with possible lupus. Plan: - continue plaquenil and imuran per rheumatology - advised her to notify rheumatology if she gets fever - she had unremarkable CT chest from October 2016 and no significant respiratory symptoms at present >> defer additional pulmonary testing for now    Patient Instructions  Follow up in 3 months     Chesley Mires, MD [facility] Pager:  [phone]

## 2015-05-08 NOTE — Patient Instructions (Signed)
Follow up in 3 months

## 2015-05-21 ENCOUNTER — Encounter: Payer: Self-pay | Admitting: Rheumatology

## 2015-05-22 ENCOUNTER — Encounter: Payer: Self-pay | Admitting: Hematology

## 2015-05-22 ENCOUNTER — Telehealth: Payer: Self-pay | Admitting: Hematology

## 2015-05-22 NOTE — Telephone Encounter (Signed)
Spoke to pt regarding ref and scheduled appt. Pt confirmed time and date

## 2015-05-22 NOTE — Telephone Encounter (Signed)
[facility] and sent out new pt packet 05/22/2015

## 2015-05-26 ENCOUNTER — Encounter (HOSPITAL_COMMUNITY): Payer: Self-pay | Admitting: Emergency Medicine

## 2015-05-26 DIAGNOSIS — Z9981 Dependence on supplemental oxygen: Secondary | ICD-10-CM | POA: Diagnosis not present

## 2015-05-26 DIAGNOSIS — Z862 Personal history of diseases of the blood and blood-forming organs and certain disorders involving the immune mechanism: Secondary | ICD-10-CM | POA: Insufficient documentation

## 2015-05-26 DIAGNOSIS — I10 Essential (primary) hypertension: Secondary | ICD-10-CM | POA: Diagnosis not present

## 2015-05-26 DIAGNOSIS — Z79899 Other long term (current) drug therapy: Secondary | ICD-10-CM | POA: Insufficient documentation

## 2015-05-26 DIAGNOSIS — M329 Systemic lupus erythematosus, unspecified: Secondary | ICD-10-CM | POA: Diagnosis not present

## 2015-05-26 DIAGNOSIS — R51 Headache: Secondary | ICD-10-CM | POA: Diagnosis not present

## 2015-05-26 DIAGNOSIS — G4733 Obstructive sleep apnea (adult) (pediatric): Secondary | ICD-10-CM | POA: Diagnosis not present

## 2015-05-26 NOTE — ED Notes (Signed)
Pt states "Elizabeth [name] Atlasve been having headaches on the L side, ive been noticing them for a week." Pain has been getting worse. Pt taking tylenol extra strength x4 day, helps for a little bit then comes back. Pt has lupus. 5/10. No neuro deficits noted, ambulatory to triage room.

## 2015-05-27 ENCOUNTER — Emergency Department (HOSPITAL_COMMUNITY)
Admission: EM | Admit: 2015-05-27 | Discharge: 2015-05-27 | Disposition: A | Discharge status: Home / Self Care | Payer: Federal, State, Local not specified - PPO | Attending: Emergency Medicine | Admitting: Emergency Medicine

## 2015-05-27 DIAGNOSIS — R51 Headache: Secondary | ICD-10-CM

## 2015-05-27 DIAGNOSIS — R519 Headache, unspecified: Secondary | ICD-10-CM

## 2015-05-27 LAB — URINALYSIS, ROUTINE W REFLEX MICROSCOPIC
GLUCOSE, UA: NEGATIVE mg/dL
HGB URINE DIPSTICK: NEGATIVE
KETONES UR: NEGATIVE mg/dL
Leukocytes, UA: NEGATIVE
NITRITE: NEGATIVE
PH: 5 (ref 5.0–8.0)
Protein, ur: 30 mg/dL — AB
Specific Gravity, Urine: 1.025 (ref 1.005–1.030)

## 2015-05-27 LAB — BASIC METABOLIC PANEL
Anion gap: 12 (ref 5–15)
BUN: 13 mg/dL (ref 6–20)
CALCIUM: 8.8 mg/dL — AB (ref 8.9–10.3)
CO2: 23 mmol/L (ref 22–32)
Chloride: 106 mmol/L (ref 101–111)
Creatinine, Ser: 0.77 mg/dL (ref 0.44–1.00)
GFR calc Af Amer: >60 mL/min (ref >60)
Glucose, Bld: 108 mg/dL — ABNORMAL HIGH (ref 65–99)
POTASSIUM: 3.7 mmol/L (ref 3.5–5.1)
SODIUM: 141 mmol/L (ref 135–145)

## 2015-05-27 LAB — CBC WITH DIFFERENTIAL/PLATELET
BASOS ABS: 0 10*3/uL (ref 0.0–0.1)
Basophils Relative: 0 %
EOS ABS: 0.1 10*3/uL (ref 0.0–0.7)
EOS PCT: 3 %
HCT: 32.9 % — ABNORMAL LOW (ref 36.0–46.0)
Hemoglobin: 10.4 g/dL — ABNORMAL LOW (ref 12.0–15.0)
Lymphocytes Relative: 10 %
Lymphs Abs: 0.5 10*3/uL — ABNORMAL LOW (ref 0.7–4.0)
MCH: 24.3 pg — AB (ref 26.0–34.0)
MCHC: 31.6 g/dL (ref 30.0–36.0)
MCV: 76.9 fL — ABNORMAL LOW (ref 78.0–100.0)
Monocytes Absolute: 0.4 10*3/uL (ref 0.1–1.0)
Monocytes Relative: 10 %
Neutro Abs: 3.5 10*3/uL (ref 1.7–7.7)
Neutrophils Relative %: 77 %
PLATELETS: 212 10*3/uL (ref 150–400)
RBC: 4.28 MIL/uL (ref 3.87–5.11)
RDW: 15.8 % — ABNORMAL HIGH (ref 11.5–15.5)
WBC: 4.5 10*3/uL (ref 4.0–10.5)

## 2015-05-27 LAB — C-REACTIVE PROTEIN: CRP: 2.1 mg/dL — ABNORMAL HIGH (ref <1.0)

## 2015-05-27 LAB — SEDIMENTATION RATE: Sed Rate: 89 mm/hr — ABNORMAL HIGH (ref 0–22)

## 2015-05-27 LAB — URINE MICROSCOPIC-ADD ON
RBC / HPF: NONE SEEN RBC/hpf (ref 0–5)
WBC UA: NONE SEEN WBC/hpf (ref 0–5)

## 2015-05-27 MED ORDER — METHYLPREDNISOLONE SODIUM SUCC 125 MG IJ SOLR
125.0000 mg | Freq: Once | INTRAMUSCULAR | Status: AC
  Administered 2015-05-27: 125 mg via INTRAVENOUS

## 2015-05-27 MED ORDER — PREDNISONE 20 MG PO TABS: 20.0000 mg | ORAL_TABLET | Freq: Every day | ORAL | Status: DC

## 2015-05-27 MED ORDER — METHYLPREDNISOLONE SODIUM SUCC 125 MG IJ SOLR
INTRAMUSCULAR | Status: AC
  Filled 2015-05-27: qty 2

## 2015-05-27 NOTE — ED Provider Notes (Signed)
TIME SEEN: 1:37 AM  CHIEF COMPLAINT:  Chief Complaint  Patient presents with  . Headache  . Lupus    HPI:  HPI Comments: Elizabeth [name] is a 65 y.o. female with hx of lupus on Plaquenil, who presents to the Emergency Department complaining of gradual onset, constant, worsening, left-sided 5/10 temporal headache which has persisted for the past week and a half. Pain is now 3/10. Pt reports no similar sx in the past. Pt has tried taking extra strength tylenol x4 a day with little relief. She is ambulatory. She is not having any vision changes, CP or SOB at this time. No jaw claudication. No history of lupus cerebritis. Pt reports associated sx of lupus to be swollen joints and extreme fatigue.  Pt is allergic to imuran. She was prescribed a 20 day course of Prednisone for her joint edema which she finished in January. She is on Plaquenil denies any missed doses. States she was seen by her rheumatologist are sent her to the emergency department to rule out temporal arteritis.  ROS: See HPI Constitutional: no fever  Eyes: no drainage  ENT: no runny nose   Cardiovascular:  no chest pain  Resp: no SOB  GI: no vomiting GU: no dysuria Integumentary: no rash  Allergy: no hives  Musculoskeletal: no leg swelling  Neurological: no slurred speech ROS otherwise negative  PAST MEDICAL HISTORY/PAST SURGICAL HISTORY:  Past Medical History  Diagnosis Date  . Abnormal EKG   . Anemia   . Hypertension   . Sjogren's syndrome ([place]) 03/22/2011    [facility]  . OSA (obstructive sleep apnea)     CPAP    MEDICATIONS:  Prior to Admission medications   Medication Sig Start Date End Date Taking? Authorizing Provider  albuterol (PROVENTIL HFA;VENTOLIN HFA) 108 (90 BASE) MCG/ACT inhaler Inhale 1-2 puffs into the lungs every 6 (six) hours as needed for wheezing or shortness of breath.   Yes Historical Provider, MD  amLODipine (NORVASC) 10 MG tablet Take 10 mg by mouth daily. Reported on 05/08/2015   Yes  Historical Provider, MD  Cholecalciferol (VITAMIN D3) 50000 units TABS Take 1 tablet by mouth every 7 (seven) days. Take on fridays   Yes Historical Provider, MD  hydroxychloroquine (PLAQUENIL) 200 MG tablet Take 100-200 mg by mouth See admin instructions. Take 1 tablet in the morning then half a tablet a night   Yes Historical Provider, MD  Polyvinyl Alcohol-Povidone (REFRESH OP) Place 1 drop into both eyes daily as needed. For dry eyes   Yes Historical Provider, MD    ALLERGIES:  Allergies  Allergen Reactions  . Food     Strawberries: itching   . Imuran [Azathioprine]     Made me hurt all over     SOCIAL HISTORY:  Social History  Substance Use Topics  . Smoking status: Never Smoker   . Smokeless tobacco: Never Used  . Alcohol Use: No    FAMILY HISTORY: Family History  Problem Relation Age of Onset  . Arthritis Mother   . Diabetes Mother   . Hypertension Mother   . Heart disease Father   . Hypertension Father   . Hypertension Brother   . Asthma Paternal Grandmother     EXAM: BP 140/70 mmHg  Pulse 69  Temp(Src) 99.2 F (37.3 C) (Oral)  Resp 20  Ht 5' 3.5" (1.613 m)  Wt 243 lb 11.2 oz (110.542 kg)  BMI 42.49 kg/m2  SpO2 99% CONSTITUTIONAL: Alert and oriented and responds appropriately to questions.  Well-appearing; well-nourished, afebrile and nontoxic HEAD: Normocephalic EYES: Conjunctivae clear, PERRL, extraocular movements intact, normal visual fields ENT: normal nose; no rhinorrhea; moist mucous membranes; patient is tender to palpation over the left temple without erythema or warmth, no facial swelling. No tenderness over the left TMJ. No signs of mastoiditis. No Battle sign. NECK: Supple, no meningismus, no LAD  CARD: RRR; S1 and S2 appreciated; no murmurs, no clicks, no rubs, no gallops RESP: Normal chest excursion without splinting or tachypnea; breath sounds clear and equal bilaterally; no wheezes, no rhonchi, no rales, no hypoxia or respiratory distress,  speaking full sentences ABD/GI: Normal bowel sounds; non-distended; soft, non-tender, no rebound, no guarding, no peritoneal signs BACK:  The back appears normal and is non-tender to palpation, there is no CVA tenderness EXT: Normal ROM in all joints; non-tender to palpation; no edema; normal capillary refill; no cyanosis, no calf tenderness or swelling    SKIN: Normal color for age and race; warm; no rash NEURO: Moves all extremities equally, sensation to light touch intact diffusely, cranial nerves II through XII intact, strength 5/5 in all 4 extremity, normal gait PSYCH: The patient's mood and manner are appropriate. Grooming and personal hygiene are appropriate.  MEDICAL DECISION MAKING: Patient here with concerns for left temporal arteritis. She has no jaw claudication or vision changes. Neurologically intact. Afebrile. No history of head injury. Nominal anticoagulation. Has never had similar headaches but describes this as gradual, intermittent and is a 3/10 now. Declines any pain medication. Is currently on Plaquenil. We'll obtain labs including sedimentation rate and CRP. States that she then told that her sedimentation rate recently was over 100. We'll discuss with neurology on call for further recommendations. 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 Doubt intracranial hemorrhage or stroke.  ED PROGRESS: Discussed with Dr. Leonel Ramsay on call for neurology. 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 He agrees with starting her on steroids. Will give dose of IV Solu-Medrol in the emergency department. He reports given localized headache without neurologic deficits he doubts lupus cerebritis. Does not feel patient needs emergent head imaging and I agree. Her labs show elevated CRP and ESR. No leukocytosis. Patient reports headache is almost completely gone after IV  Solu-Medrol. We'll discharge on prednisone taper but have discussed with her that she may need a more prolonged taper and she will likely need a biopsy but this will be done as an outpatient and is not an emergent procedure. Discussed with her that if she develops neurologic deficits, changes in her vision, worsening or different severe headache that she should return to the hospital.  Patient verbalizes understanding and is comfortable with this plan. Her rheumatologist is Dr. Estanislado Pandy.    I personally performed the services described in this documentation, which was scribed in my presence. The recorded information has been reviewed and is accurate.    E. Lopez, DO 05/27/15 [phone]

## 2015-05-27 NOTE — Discharge Instructions (Signed)
You were seen in the emergency department for intermittent left-sided headaches. This may be temporal arteritis. Your labs today have been normal other than mild elevation of your CRP. Your ESR level is pending. We  Have discussed her case with Dr. Leonel Ramsay who is our neurologist on call.  We both feel that  you can be followed up as an outpatient with your rheumatologist for further workup for this including possible biopsy of your temporal artery. There is no emergent imaging that can confirm this diagnosis.  We have placed you on a steroid taper to help with your symptoms. Sometimes people need to be on a longer steroid taper but this can be managed by your rheumatologist. You may take Tylenol 1000 mg every 6 hours as needed for pain. If you have  Worsening headache, changes in your vision, numbness or weakness on one side of your body, changes to your speech , please return to the hospital.   Possible Temporal Arteritis Temporal arteritis, also called giant cell arteritis, is a condition that causes arteries to become swollen (inflamed). It usually affects arteries in your head and face, but arteries in any part of the body can become inflamed. Temporal arteritis can cause serious problems, such as bone loss, diabetes, and blindness. CAUSES  The cause is unknown. RISK FACTORS  Being older than 48.  Being a woman.  Being Caucasian.  Being of [place], [place], [place], [place] or [place] ancestry.  Having polymyalgia rheumatica (PMR). SIGNS AND SYMPTOMS  Some people with temporal arteritis have just one symptom, while other have several symptoms. Most signs and symptoms are related to the head and face. Signs and symptoms may include:  Hard or swollen temples (common). Your temples are the flattened area on either side of your forehead. If your temples are swollen, it may hurt to touch them.  Pain when combing your hair or when laying your head down.  Pain in the jaw when  chewing.  Pain in the throat or tongue.  Problems with your vision, such as sudden loss of vision in one eye, or seeing double.  Fever.  Fatigue.  A dry cough.  Pain in the hips and shoulders.  Pain in the arms during exercise.  Depression.  Weight loss. DIAGNOSIS  Your health care provider will ask about your symptoms and do a physical exam. He or she may also perform an eye exam and tests, such as:  A complete blood count.  An erythrocyte sedimentation rate test, also called the sed rate test.  A C-reactive protein (CRP) test.  A tissue sample (biopsy) test. TREATMENT  Temporal arteritis is treated with a type of medicine called a corticosteroid. Vision problems may be treated with additional medicines. You will need to see your health care provider while you are being treated. During follow-up visits, your health care provider will check for problems by:  Performing blood tests and bone density tests.  Checking your blood pressure and blood sugar. HOME CARE INSTRUCTIONS   Take medicines only as directed by your health care provider.  Take any vitamins or supplements that your health care provider suggests. These may include vitamin D and calcium, which help keep your bones from becoming weak.  Exercise. Talk with your health care provider about what exercises are okay for you to do. Usually exercises that increase your heart rate (aerobic exercise), such as walking, are recommended. Aerobic exercise helps control your blood pressure and prevent bone loss.  Follow a healthy diet. Include  healthy sources of protein, fruits, vegetables, and whole grains in your diet. Following a healthy diet helps prevent bone damage and diabetes. SEEK MEDICAL CARE IF:   Your symptoms get worse.  Your fever, fatigue, headache, weight loss, or pain in your jaw gets worse.  You develop signs of infection, such as fever, swelling, redness, warmth, and tenderness. SEEK IMMEDIATE  MEDICAL CARE IF:   Your vision gets worse.  Your pain does not go away, even after you take pain medicine.  You have chest pain.  You have trouble breathing.  One side of your face or body suddenly becomes weak or numb. MAKE SURE YOU:  Understand these instructions.  Will watch your condition.  Will get help right away if you are not doing well or get worse.   This information is not intended to replace advice given to you by your health care provider. Make sure you discuss any questions you have with your health care provider.   Document Released: 01/02/2009 Document Revised: 03/28/2014 Document Reviewed: 05/01/2013 Elsevier Interactive Patient Education [employer].

## 2015-06-03 ENCOUNTER — Ambulatory Visit (HOSPITAL_BASED_OUTPATIENT_CLINIC_OR_DEPARTMENT_OTHER): Payer: Federal, State, Local not specified - PPO | Admitting: Hematology

## 2015-06-03 ENCOUNTER — Other Ambulatory Visit (HOSPITAL_BASED_OUTPATIENT_CLINIC_OR_DEPARTMENT_OTHER): Payer: Federal, State, Local not specified - PPO

## 2015-06-03 ENCOUNTER — Encounter: Payer: Self-pay | Admitting: Hematology

## 2015-06-03 VITALS — BP 142/69 | HR 70 | Temp 98.3°F | Resp 19 | Ht 63.5 in | Wt 256.3 lb

## 2015-06-03 DIAGNOSIS — R700 Elevated erythrocyte sedimentation rate: Secondary | ICD-10-CM

## 2015-06-03 DIAGNOSIS — D892 Hypergammaglobulinemia, unspecified: Secondary | ICD-10-CM

## 2015-06-03 DIAGNOSIS — D509 Iron deficiency anemia, unspecified: Secondary | ICD-10-CM

## 2015-06-03 LAB — COMPREHENSIVE METABOLIC PANEL
ALBUMIN: 3 g/dL — AB (ref 3.5–5.0)
ALK PHOS: 53 U/L (ref 40–150)
ALT: 33 U/L (ref 0–55)
ANION GAP: 7 meq/L (ref 3–11)
AST: 15 U/L (ref 5–34)
BUN: 21.6 mg/dL (ref 7.0–26.0)
CO2: 25 meq/L (ref 22–29)
CREATININE: 0.9 mg/dL (ref 0.6–1.1)
Calcium: 8.3 mg/dL — ABNORMAL LOW (ref 8.4–10.4)
Chloride: 109 mEq/L (ref 98–109)
EGFR: 84 mL/min/{1.73_m2} — AB (ref >90)
GLUCOSE: 149 mg/dL — AB (ref 70–140)
Potassium: 3.9 mEq/L (ref 3.5–5.1)
Sodium: 141 mEq/L (ref 136–145)
TOTAL PROTEIN: 7.5 g/dL (ref 6.4–8.3)

## 2015-06-03 LAB — CBC & DIFF AND RETIC
BASO%: 0.1 % (ref 0.0–2.0)
BASOS ABS: 0 10*3/uL (ref 0.0–0.1)
EOS ABS: 0 10*3/uL (ref 0.0–0.5)
EOS%: 0.1 % (ref 0.0–7.0)
HCT: 34.3 % — ABNORMAL LOW (ref 34.8–46.6)
HEMOGLOBIN: 11 g/dL — AB (ref 11.6–15.9)
IMMATURE RETIC FRACT: 23.6 % — AB (ref 1.60–10.00)
LYMPH#: 1.8 10*3/uL (ref 0.9–3.3)
LYMPH%: 12.2 % — AB (ref 14.0–49.7)
MCH: 25.4 pg (ref 25.1–34.0)
MCHC: 32.1 g/dL (ref 31.5–36.0)
MCV: 79.2 fL — AB (ref 79.5–101.0)
MONO#: 0.9 10*3/uL (ref 0.1–0.9)
MONO%: 6.1 % (ref 0.0–14.0)
NEUT#: 12.1 10*3/uL — ABNORMAL HIGH (ref 1.5–6.5)
NEUT%: 81.5 % — AB (ref 38.4–76.8)
RBC: 4.33 10*6/uL (ref 3.70–5.45)
RDW: 18.6 % — ABNORMAL HIGH (ref 11.2–14.5)
RETIC CT ABS: 208.27 10*3/uL — AB (ref 33.70–90.70)
Retic %: 4.81 % — ABNORMAL HIGH (ref 0.70–2.10)
WBC: 14.8 10*3/uL — ABNORMAL HIGH (ref 3.9–10.3)

## 2015-06-03 LAB — TECHNOLOGIST REVIEW

## 2015-06-03 LAB — IRON AND TIBC
%SAT: 27 % (ref 21–57)
Iron: 63 ug/dL (ref 41–142)
TIBC: 230 ug/dL — ABNORMAL LOW (ref 236–444)
UIBC: 167 ug/dL (ref 120–384)

## 2015-06-03 LAB — LACTATE DEHYDROGENASE: LDH: 237 U/L (ref 125–245)

## 2015-06-03 LAB — CHCC SMEAR

## 2015-06-03 LAB — FERRITIN: FERRITIN: 674 ng/mL — AB (ref 9–269)

## 2015-06-03 NOTE — Progress Notes (Signed)
Marland Kitchen    HEMATOLOGY/ONCOLOGY CONSULTATION NOTE  Date of Service: 06/03/2015  Patient Care Team: Elizabeth Stains, MD as PCP - General (Family Medicine)   Rheumatologist: Elizabeth [name]  CHIEF COMPLAINTS/PURPOSE OF CONSULTATION:  Elevated sedimentation rate.  HISTORY OF PRESENTING ILLNESS:   Elizabeth [name] is a wonderful 65 y.o. female who has been referred to [place] by Elizabeth [name] for evaluation of elevated sedimentation rate.  Elizabeth [name] has a history of hypertension, sleep apnea noncompliant with CPAP, anemia and autoimmune disease with positive ANA and Smith antibody, positive RNP, positive Ro antibody and negative double-stranded DNA.  She follows with Elizabeth. Estanislado [name] for her rheumatology care.  She has had very high sedimentation rates recently and was also noted to have a flare of her arthritis with increased pain and discomfort.  She also has a history of sicca symptoms suggestive of Sjogren's disease.  Patient follows with Elizabeth [name] from pulmonary for nonspecific interstitial pneumonitis which has not needed any specific treatment at this time.  She has previously responded very well to prednisone and Plaquenil.  Rheumatology is avoiding the use of methotrexate due to her interstitial lung disease.  She was recently started on Imuran and notes that she had increasing joint pains and muscle pains and this was discontinued.  Patient presented to the emergency room on 05/27/2015 with severe left temporal headaches and a sedimentation rate of 89.  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  Patient was discharged home on a slow taper of high-dose steroids.  Her taper has been readjusted by rheumatology.  She has been set up for an outpatient consultation for temporal artery biopsy to confirm this diagnosis.  On clinic followup today the patient notes that her headaches are much better.  No fevers or chills.  No night sweats.  She notes that  her joint pains are much improved since she has been on the steroids.  No other new focal neurological deficits.  Patient notes that she has gained about 10 pounds of fluid weight since being on the steroids and was encouraged to call her primary care physician/rheumatologist for consideration of Lasix and management of her ongoing steroid therapy which is for her autoimmune condition/giant cell arteritis.  We were consulted to determine if there is any other etiology for her elevated sedimentation rate.  Clearly her symptomatic autoimmune condition and giant cell arteritis can certainly cause for the elevated sedimentation rates.  She notes no new lymphadenopathy.  No new abdominal pain.  No worsening shortness of breath or chest pain.  MEDICAL HISTORY:  Past Medical History  Diagnosis Date  . Abnormal EKG   . Anemia   . Hypertension   . Sjogren's syndrome ([place]) 03/22/2011    [place]  . 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 Cholecystectomy    . Abdominal hysterectomy    . Paratoid tumor removed    Hysterectomy - DUB from fibroids.  SOCIAL HISTORY: Social History   Social History  . Marital Status: Single    Spouse Name: N/A  . Number of Children: N/A  . Years of Education: N/A   Occupational History  . retired [employer]   Social History Main Topics  . Smoking status: Never  Smoker   . Smokeless tobacco: Never Used  . Alcohol Use: No  . Drug Use: No  . Sexual Activity: No   Other Topics Concern  . Not on file   Social History Narrative    FAMILY HISTORY: Family History  Problem Relation Age of Onset  . Arthritis Mother   . Diabetes Mother   . Hypertension Mother   . Heart disease  Father   . Hypertension Father   . Hypertension Brother   . Asthma Paternal Grandmother     ALLERGIES:  is allergic to food and imuran.  MEDICATIONS:  Current Outpatient Prescriptions  Medication Sig Dispense Refill  . albuterol (PROVENTIL HFA;VENTOLIN HFA) 108 (90 BASE) MCG/ACT inhaler Inhale 1-2 puffs into the lungs every 6 (six) hours as needed for wheezing or shortness of breath.    Marland Kitchen amLODipine (NORVASC) 10 MG tablet Take 10 mg by mouth daily. Reported on 05/08/2015    . aspirin 81 MG tablet Take 81 mg by mouth daily.    . Cholecalciferol (VITAMIN D3) 50000 units TABS Take 1 tablet by mouth every 7 (seven) days. Take on fridays    . hydroxychloroquine (PLAQUENIL) 200 MG tablet Take 100-200 mg by mouth See admin instructions. Take 1 tablet in the morning then half a tablet a night    . Polyvinyl Alcohol-Povidone (REFRESH OP) Place 1 drop into both eyes daily as needed. For dry eyes    . predniSONE (DELTASONE) 20 MG tablet Take 1 tablet (20 mg total) by mouth daily. Take 44m x 3 days then 50 mg x 3 days then 40 mg x 3 days then 30 mg x 3 days then 20 mg x 3 days then 10 mg x 3 days (Patient taking differently: Take 10-60 mg by mouth See admin instructions. Take 657mx 3 days then 50 mg x 3 days then 40 mg x 3 days then 30 mg x 3 days then 20 mg x 3 days then 10 mg x 3 days) 86 tablet 0  . furosemide (LASIX) 20 MG tablet Take 20 mg by mouth daily.     No current facility-administered medications for this visit.    REVIEW OF SYSTEMS:    10 Point review of Systems was done is negative except as noted above.  PHYSICAL EXAMINATION: ECOG PERFORMANCE STATUS: 2 - Symptomatic, <50% confined to bed  . Filed Vitals:   06/03/15 1343  BP: 142/69  Pulse: 70  Temp: 98.3 F (36.8 C)  Resp: 19   Filed Weights   06/03/15 1343  Weight: 256 lb 4.8 oz (116.257 kg)   .Body mass index is 44.68 kg/(m^2).  GENERAL:alert, in no acute distress SKIN: no acute rashes EYES: normal, conjunctiva  are pink and non-injected, sclera clear OROPHARYNX:no exudate, no erythema and lips, buccal mucosa, and tongue normal  NECK: supple, no JVD, thyroid normal size, non-tender, without nodularity LYMPH:  no palpable lymphadenopathy in the cervical, axillary or inguinal LUNGS: clear to auscultation with normal respiratory effort HEART: regular rate & rhythm,  no murmurs and no lower extremity edema ABDOMEN: abdomen soft, non-tender, normoactive bowel sounds  Musculoskeletal: no cyanosis of digits and no clubbing  PSYCH: alert & oriented x 3 with fluent speech NEURO: no focal motor/sensory deficits  LABORATORY DATA:  I have reviewed the data as listed  . CBC Latest Ref Rng 06/04/2015 06/03/2015 05/27/2015  WBC 4.0 - 10.5 K/uL 16.3(H) 14.8(H) 4.5  Hemoglobin 12.0 - 15.0 g/dL 10.8(L) 11.0(L) 10.4(L)  Hematocrit 36.0 - 46.0 %  35.4(L) 34.3(L) 32.9(L)  Platelets 150 - 400 K/uL 245 229 Large platelets present 212   . CBC    Component Value Date/Time   WBC 16.3* 06/04/2015 1335   WBC 14.8* 06/03/2015 1459   WBC 8.0 02/05/2012 1026   RBC 4.33 06/04/2015 1335   RBC 4.33 06/03/2015 1459   RBC 4.97 02/05/2012 1026   HGB 10.8* 06/04/2015 1335   HGB 11.0* 06/03/2015 1459   HGB 12.1* 02/05/2012 1026   HCT 35.4* 06/04/2015 1335   HCT 34.3* 06/03/2015 1459   HCT 40.5 02/05/2012 1026   PLT 245 06/04/2015 1335   PLT 229 Large platelets present 06/03/2015 1459   MCV 81.8 06/04/2015 1335   MCV 79.2* 06/03/2015 1459   MCV 81.4 02/05/2012 1026   MCH 24.9* 06/04/2015 1335   MCH 25.4 06/03/2015 1459   MCH 24.3* 02/05/2012 1026   MCHC 30.5 06/04/2015 1335   MCHC 32.1 06/03/2015 1459   MCHC 29.9* 02/05/2012 1026   RDW 19.3* 06/04/2015 1335   RDW 18.6* 06/03/2015 1459   LYMPHSABS 1.8 06/03/2015 1459   LYMPHSABS 0.5* 05/27/2015 0211   MONOABS 0.9 06/03/2015 1459   MONOABS 0.4 05/27/2015 0211   EOSABS 0.0 06/03/2015 1459   EOSABS 0.1 05/27/2015 0211   BASOSABS 0.0 06/03/2015 1459   BASOSABS 0.0  05/27/2015 0211   . CMP Latest Ref Rng 06/03/2015 05/27/2015 08/19/2008  Glucose 70 - 140 mg/dl 149(H) 108(H) 107(H)  BUN 7.0 - 26.0 mg/dL 21.'6 13 11  ' Creatinine 0.6 - 1.1 mg/dL 0.9 0.77 0.83  Sodium 136 - 145 mEq/L 141 141 137  Potassium 3.5 - 5.1 mEq/L 3.9 3.7 3.8  Chloride 101 - 111 mmol/L - 106 104  CO2 22 - 29 mEq/L '25 23 25  ' Calcium 8.4 - 10.4 mg/dL 8.3(L) 8.8(L) 8.1(L)  Total Protein 6.4 - 8.3 g/dL 7.5 - -  Total Bilirubin 0.20 - 1.20 mg/dL <0.30 - -  Alkaline Phos 40 - 150 U/L 53 - -  AST 5 - 34 U/L 15 - -  ALT 0 - 55 U/L 33 - -   Component     Latest Ref Rng 05/27/2015 06/03/2015  Iron     41 - 142 ug/dL  63  TIBC     236 - 444 ug/dL  230 (L)  UIBC     120 - 384 ug/dL  167  %SAT     21 - 57 %  27  Ig Kappa Free Light Chain     3.30 - 19.40 mg/L  24.70 (H)  Ig Lambda Free Light Chain     5.71 - 26.30 mg/L  26.04  Kappa/Lambda FluidC Ratio     0.26 - 1.65  0.95  Sed Rate     0 - 40 mm/hr 89 (H) 45 (H)  Ferritin     9 - 269 ng/ml  674 (H)  LDH     125 - 245 U/L  237  Haptoglobin     34 - 200 mg/dL  202 (H)    RADIOGRAPHIC STUDIES: I have personally reviewed the radiological images as listed and agreed with the findings in the report. No results found.  ASSESSMENT & PLAN:   65 year old very pleasant female with  #1 elevated sedimentation rate.  This is likely related to her underlying autoimmune condition and now more recently with the presumptive diagnosis of giant cell arteritis made when she presented to the emergency room on 05/27/2015 with severe temporal headaches.  Her sedimentation rate has come  down from about 90 down to 45  steroids for her giant cell arteritis. Patient has no significant peripheral palpable lymphadenopathy or palpable hepatosplenomegaly and has a normal LDH level -no overt evidence of lymphoma.  Her total protein levels are within normal limits.  Free serum light chains did not show a clonal elevation.  Serum protein electrophoresis  currently pending.  No hypercalcemia, renal failure or other symptoms suggestive of multiple myeloma at this time. Patient has had chronic mild anemia likely related to anemia of chronic disease.  #2 borderline microcytic anemia with elevated ferritin.  This is suggestive of anemia of chronic disease related to her underlying autoimmune condition.  Plan -Overall the patient's elevated sedimentation rate which is responding to steroids being used for treatment of her newly diagnosed giant cell arteritis is likely explained by the patient's underlying autoimmune condition/GCA. No overt evidence of lymphoma or multiple myeloma at this time as additional risk factors for elevated sed rates. -No overt evidence of solid tumor.  Patient would be at elevated risk of malignancies in general in association with her autoimmune condition and would need to followup closely with her primary care physician for age-appropriate cancer screening. -She appears to have some fluid retention from her steroids.  With that her primary care physician/rheumatology consider diuretics to manage the potential side effects of ongoing steroid therapy. -We'll followup on her final SPEP/IFE results. -.  The patient's anemia were to worsen despite optimal therapy of her autoimmune condition might need to consider ESA's. -Elevated ferritin is likely a reflection of her significant inflammation and is elevated as an acute phase reactant. -She has follow up for a temporal artery biopsy to confirm her diagnosis of GCA #3 Nonspecific interstitial pneumonitis -Continued followup and management by pulmonary  #4 vitamin D deficiency - on replacement.  Return to care with Elizabeth. Irene Limbo on an as-needed basis if any new questions or concerns arise.   All of the patients questions were answered with apparent satisfaction. The patient knows to call the clinic with any problems, questions or concerns.  I spent 60 minutes counseling the patient  face to face. The total time spent in the appointment was 60 minutes and more than 50% was on counseling and direct patient cares.    Sullivan Lone MD [place] AAHIVMS [facility] Hematology/Oncology Physician [facility]  (Office):       [phone] (Work cell):  [phone] (Fax):           [phone]  06/03/2015 2:07 PM

## 2015-06-04 ENCOUNTER — Encounter (HOSPITAL_COMMUNITY): Payer: Self-pay

## 2015-06-04 ENCOUNTER — Other Ambulatory Visit (HOSPITAL_COMMUNITY): Payer: Self-pay | Admitting: Rheumatology

## 2015-06-04 ENCOUNTER — Ambulatory Visit: Payer: Self-pay | Admitting: Surgery

## 2015-06-04 ENCOUNTER — Encounter (HOSPITAL_COMMUNITY)
Admission: RE | Admit: 2015-06-04 | Discharge: 2015-06-04 | Disposition: A | Discharge status: Home / Self Care | Payer: Federal, State, Local not specified - PPO | Source: Ambulatory Visit | Attending: Surgery | Admitting: Surgery

## 2015-06-04 DIAGNOSIS — I10 Essential (primary) hypertension: Secondary | ICD-10-CM | POA: Diagnosis not present

## 2015-06-04 DIAGNOSIS — Z01812 Encounter for preprocedural laboratory examination: Secondary | ICD-10-CM | POA: Diagnosis not present

## 2015-06-04 DIAGNOSIS — Z01818 Encounter for other preprocedural examination: Secondary | ICD-10-CM | POA: Insufficient documentation

## 2015-06-04 DIAGNOSIS — R51 Headache: Secondary | ICD-10-CM | POA: Diagnosis not present

## 2015-06-04 DIAGNOSIS — R9431 Abnormal electrocardiogram [ECG] [EKG]: Secondary | ICD-10-CM | POA: Insufficient documentation

## 2015-06-04 HISTORY — DX: Other specified disorders involving the immune mechanism, not elsewhere classified: D89.89

## 2015-06-04 HISTORY — DX: Headache, unspecified: R51.9

## 2015-06-04 HISTORY — DX: Reserved for inherently not codable concepts without codable children: IMO0001

## 2015-06-04 HISTORY — DX: Headache: R51

## 2015-06-04 LAB — HAPTOGLOBIN: HAPTOGLOBIN: 202 mg/dL — AB (ref 34–200)

## 2015-06-04 LAB — CBC
HCT: 35.4 % — ABNORMAL LOW (ref 36.0–46.0)
Hemoglobin: 10.8 g/dL — ABNORMAL LOW (ref 12.0–15.0)
MCH: 24.9 pg — ABNORMAL LOW (ref 26.0–34.0)
MCHC: 30.5 g/dL (ref 30.0–36.0)
MCV: 81.8 fL (ref 78.0–100.0)
Platelets: 245 10*3/uL (ref 150–400)
RBC: 4.33 MIL/uL (ref 3.87–5.11)
RDW: 19.3 % — AB (ref 11.5–15.5)
WBC: 16.3 10*3/uL — ABNORMAL HIGH (ref 4.0–10.5)

## 2015-06-04 LAB — KAPPA/LAMBDA LIGHT CHAINS
Ig Kappa Free Light Chain: 24.7 mg/L — ABNORMAL HIGH (ref 3.30–19.40)
Ig Lambda Free Light Chain: 26.04 mg/L (ref 5.71–26.30)
Kappa/Lambda FluidC Ratio: 0.95 (ref 0.26–1.65)

## 2015-06-04 LAB — SEDIMENTATION RATE: SED RATE: 45 mm/h — AB (ref 0–40)

## 2015-06-04 NOTE — Progress Notes (Signed)
01/14/15- CT Chest- [facility]  06/03/2015- LOV - oncology/hematology in [facility]

## 2015-06-04 NOTE — Progress Notes (Addendum)
CBC results done 06/04/2015 faxed to Dr Darnell Levelodd Gerkin on 06/04/2015.  Also called office and spoke with Burnie in office and Dr Gerrit FriendsGerkin is in office today ( 3/16/20170 and she will let him know to look at CBC results on patient of today.

## 2015-06-04 NOTE — Patient Instructions (Signed)
Hilarie FredricksonMarchia A Gohlke  06/04/2015   Your procedure is scheduled on: 06/09/2015    [facility]  [place]vators to 3rd floor to  Short Stay Center at    [phone].  Call this number if you have problems the morning of surgery [phone]   Remember: ONLY 1 PERSON MAY GO WITH YOU TO SHORT STAY TO GET  READY MORNING OF YOUR SURGERY.  Do not eat food or drink liquids :After Midnight.     Take these medicines the morning of surgery with A SIP OF WATER: Albuterol Inhaler if needed and bring, Amlodipine ( Norvasc), Refresh eye drops if needed                                 You may not have any metal on your body including hair pins and              piercings  Do not wear jewelry, make-up, lotions, powders or perfumes, deodorant             Do not wear nail polish.  Do not shave  48 hours prior to surgery.                 Do not bring valuables to the hospital. [facility] IS NOT             RESPONSIBLE   FOR VALUABLES.  Contacts, dentures or bridgework may not be worn into surgery.      Patients discharged the day of surgery will not be allowed to drive home.  Name and phone number of your driver:  Special Instructions: coughing and deep breathing exercises, leg exercises               Please read over the following fact sheets you were given: _____________________________________________________________________             [facility] - Preparing [facility] Before surgery, you can play an important role.  Because skin is not sterile, your skin needs to be as free of germs as possible.  You can reduce the number of germs on your skin by washing with CHG (chlorahexidine gluconate) soap before surgery.  CHG is an antiseptic cleaner which kills germs and bonds with the skin to continue killing germs even after washing. Please DO NOT use if you have an allergy to CHG or antibacterial soaps.  If your skin becomes reddened/irritated stop using  the CHG and inform your nurse when you arrive at Short Stay. Do not shave (including legs and underarms) for at least 48 hours prior to the first CHG shower.  You may shave your face/neck. Please follow these instructions carefully:  1.  Shower with CHG Soap the night before surgery and the  morning of Surgery.  2.  If you choose to wash your hair, wash your hair first as usual with your  normal  shampoo.  3.  After you shampoo, rinse your hair and body thoroughly to remove the  shampoo.                           4.  Use CHG as you would any other liquid soap.  You can apply chg directly  to the skin and wash  Gently with a scrungie or clean washcloth.  5.  Apply the CHG Soap to your body ONLY FROM THE NECK DOWN.   Do not use on face/ open                           Wound or open sores. Avoid contact with eyes, ears mouth and genitals (private parts).                       Wash face,  Genitals (private parts) with your normal soap.             6.  Wash thoroughly, paying special attention to the area where your surgery  will be performed.  7.  Thoroughly rinse your body with warm water from the neck down.  8.  DO NOT shower/wash with your normal soap after using and rinsing off  the CHG Soap.                9.  Pat yourself dry with a clean towel.            10.  Wear clean pajamas.            11.  Place clean sheets on your bed the night of your first shower and do not  sleep with pets. Day of Surgery : Do not apply any lotions/deodorants the morning of surgery.  Please wear clean clothes to the hospital/surgery center.  FAILURE TO FOLLOW THESE INSTRUCTIONS MAY RESULT IN THE CANCELLATION OF YOUR SURGERY PATIENT SIGNATURE_________________________________  NURSE SIGNATURE__________________________________  ________________________________________________________________________

## 2015-06-04 NOTE — Progress Notes (Signed)
CMP done 06/03/2015- [facility] .

## 2015-06-05 NOTE — Progress Notes (Signed)
Patient called to inform me her PCP physician had prescribed another medication for her.  Lasix 20 mg dailiy starting on 06/05/2015.  I told patient I would add to med list in [facility] and instructed patient not to take am of surgery .  Patient voiced understanding.

## 2015-06-05 NOTE — Progress Notes (Signed)
[facility]

## 2015-06-08 LAB — MULTIPLE MYELOMA PANEL, SERUM
Albumin SerPl Elph-Mcnc: 3 g/dL (ref 2.9–4.4)
Albumin/Glob SerPl: 0.8 (ref 0.7–1.7)
Alpha 1: 0.3 g/dL (ref 0.0–0.4)
Alpha2 Glob SerPl Elph-Mcnc: 0.7 g/dL (ref 0.4–1.0)
B-GLOBULIN SERPL ELPH-MCNC: 0.9 g/dL (ref 0.7–1.3)
Gamma Glob SerPl Elph-Mcnc: 2.1 g/dL — ABNORMAL HIGH (ref 0.4–1.8)
Globulin, Total: 4 g/dL — ABNORMAL HIGH (ref 2.2–3.9)
IGA/IMMUNOGLOBULIN A, SERUM: 236 mg/dL (ref 87–352)
IGM (IMMUNOGLOBIN M), SRM: 256 mg/dL — AB (ref 26–217)
IgG, Qn, Serum: 2045 mg/dL — ABNORMAL HIGH (ref 700–1600)
TOTAL PROTEIN: 7 g/dL (ref 6.0–8.5)

## 2015-06-09 ENCOUNTER — Encounter (HOSPITAL_COMMUNITY): Payer: Self-pay | Admitting: Anesthesiology

## 2015-06-09 ENCOUNTER — Ambulatory Visit (HOSPITAL_COMMUNITY): Payer: Federal, State, Local not specified - PPO | Admitting: Anesthesiology

## 2015-06-09 ENCOUNTER — Encounter (HOSPITAL_COMMUNITY)
Admission: RE | Disposition: A | Discharge status: Home / Self Care | Payer: Self-pay | Source: Ambulatory Visit | Attending: Surgery

## 2015-06-09 ENCOUNTER — Ambulatory Visit (HOSPITAL_COMMUNITY)
Admission: RE | Admit: 2015-06-09 | Discharge: 2015-06-09 | Disposition: A | Discharge status: Home / Self Care | Payer: Federal, State, Local not specified - PPO | Source: Ambulatory Visit | Attending: Surgery | Admitting: Surgery

## 2015-06-09 DIAGNOSIS — G4730 Sleep apnea, unspecified: Secondary | ICD-10-CM | POA: Insufficient documentation

## 2015-06-09 DIAGNOSIS — I672 Cerebral atherosclerosis: Secondary | ICD-10-CM | POA: Diagnosis not present

## 2015-06-09 DIAGNOSIS — R51 Headache: Secondary | ICD-10-CM | POA: Insufficient documentation

## 2015-06-09 DIAGNOSIS — I10 Essential (primary) hypertension: Secondary | ICD-10-CM | POA: Diagnosis not present

## 2015-06-09 DIAGNOSIS — Z79899 Other long term (current) drug therapy: Secondary | ICD-10-CM | POA: Diagnosis not present

## 2015-06-09 DIAGNOSIS — Z7982 Long term (current) use of aspirin: Secondary | ICD-10-CM | POA: Insufficient documentation

## 2015-06-09 DIAGNOSIS — R519 Headache, unspecified: Secondary | ICD-10-CM | POA: Diagnosis present

## 2015-06-09 DIAGNOSIS — R700 Elevated erythrocyte sedimentation rate: Secondary | ICD-10-CM | POA: Insufficient documentation

## 2015-06-09 HISTORY — PX: ARTERY BIOPSY: SHX891

## 2015-06-09 SURGERY — BIOPSY TEMPORAL ARTERY
Anesthesia: General | Site: Head | Laterality: Left

## 2015-06-09 MED ORDER — MIDAZOLAM HCL 2 MG/2ML IJ SOLN
INTRAMUSCULAR | Status: AC
  Filled 2015-06-09: qty 2

## 2015-06-09 MED ORDER — LACTATED RINGERS IV SOLN
INTRAVENOUS | Status: DC
Start: 2015-06-09 — End: 2015-06-09

## 2015-06-09 MED ORDER — CEFAZOLIN SODIUM-DEXTROSE 2-3 GM-% IV SOLR
INTRAVENOUS | Status: AC
  Filled 2015-06-09: qty 50

## 2015-06-09 MED ORDER — SUGAMMADEX SODIUM 200 MG/2ML IV SOLN
INTRAVENOUS | Status: DC | PRN
  Administered 2015-06-09: 200 mg via INTRAVENOUS

## 2015-06-09 MED ORDER — SODIUM CHLORIDE 0.9 % IJ SOLN
INTRAMUSCULAR | Status: AC
  Filled 2015-06-09: qty 10

## 2015-06-09 MED ORDER — MIDAZOLAM HCL 5 MG/5ML IJ SOLN
INTRAMUSCULAR | Status: DC | PRN
  Administered 2015-06-09: 2 mg via INTRAVENOUS

## 2015-06-09 MED ORDER — BUPIVACAINE HCL (PF) 0.25 % IJ SOLN
INTRAMUSCULAR | Status: AC
  Filled 2015-06-09: qty 30

## 2015-06-09 MED ORDER — DIPHENHYDRAMINE HCL 50 MG/ML IJ SOLN
INTRAMUSCULAR | Status: AC
  Filled 2015-06-09: qty 1

## 2015-06-09 MED ORDER — DEXAMETHASONE SODIUM PHOSPHATE 10 MG/ML IJ SOLN
INTRAMUSCULAR | Status: DC | PRN
  Administered 2015-06-09: 10 mg via INTRAVENOUS

## 2015-06-09 MED ORDER — GLYCOPYRROLATE 0.2 MG/ML IJ SOLN
INTRAMUSCULAR | Status: DC | PRN
  Administered 2015-06-09: 0.2 mg via INTRAVENOUS

## 2015-06-09 MED ORDER — SUGAMMADEX SODIUM 200 MG/2ML IV SOLN
INTRAVENOUS | Status: AC
  Filled 2015-06-09: qty 2

## 2015-06-09 MED ORDER — ONDANSETRON HCL 4 MG/2ML IJ SOLN
INTRAMUSCULAR | Status: AC
  Filled 2015-06-09: qty 2

## 2015-06-09 MED ORDER — GLYCOPYRROLATE 0.2 MG/ML IJ SOLN
INTRAMUSCULAR | Status: AC
  Filled 2015-06-09: qty 1

## 2015-06-09 MED ORDER — DEXAMETHASONE SODIUM PHOSPHATE 10 MG/ML IJ SOLN
INTRAMUSCULAR | Status: AC
  Filled 2015-06-09: qty 1

## 2015-06-09 MED ORDER — BUPIVACAINE HCL (PF) 0.25 % IJ SOLN
INTRAMUSCULAR | Status: DC | PRN
  Administered 2015-06-09: 10 mL

## 2015-06-09 MED ORDER — EPHEDRINE SULFATE 50 MG/ML IJ SOLN
INTRAMUSCULAR | Status: AC
  Filled 2015-06-09: qty 1

## 2015-06-09 MED ORDER — LACTATED RINGERS IV SOLN
INTRAVENOUS | Status: DC | PRN
  Administered 2015-06-09: 08:00:00 via INTRAVENOUS

## 2015-06-09 MED ORDER — SUCCINYLCHOLINE CHLORIDE 20 MG/ML IJ SOLN
INTRAMUSCULAR | Status: DC | PRN
  Administered 2015-06-09: 100 mg via INTRAVENOUS

## 2015-06-09 MED ORDER — FENTANYL CITRATE (PF) 100 MCG/2ML IJ SOLN
INTRAMUSCULAR | Status: DC | PRN
  Administered 2015-06-09: 100 ug via INTRAVENOUS

## 2015-06-09 MED ORDER — ROCURONIUM BROMIDE 100 MG/10ML IV SOLN
INTRAVENOUS | Status: DC | PRN
  Administered 2015-06-09: 40 mg via INTRAVENOUS

## 2015-06-09 MED ORDER — PROPOFOL 10 MG/ML IV BOLUS
INTRAVENOUS | Status: AC
  Filled 2015-06-09: qty 40

## 2015-06-09 MED ORDER — CEFAZOLIN SODIUM-DEXTROSE 2-3 GM-% IV SOLR
2.0000 g | INTRAVENOUS | Status: AC
  Administered 2015-06-09: 2 g via INTRAVENOUS

## 2015-06-09 MED ORDER — PROPOFOL 10 MG/ML IV BOLUS
INTRAVENOUS | Status: DC | PRN
  Administered 2015-06-09: 140 mg via INTRAVENOUS

## 2015-06-09 MED ORDER — MEPERIDINE HCL 50 MG/ML IJ SOLN: 6.2500 mg | INTRAMUSCULAR | Status: DC | PRN

## 2015-06-09 MED ORDER — HYDROCODONE-ACETAMINOPHEN 5-325 MG PO TABS: 1.0000 | ORAL_TABLET | ORAL | Status: DC | PRN

## 2015-06-09 MED ORDER — PHENYLEPHRINE 40 MCG/ML (10ML) SYRINGE FOR IV PUSH (FOR BLOOD PRESSURE SUPPORT)
PREFILLED_SYRINGE | INTRAVENOUS | Status: AC
  Filled 2015-06-09: qty 10

## 2015-06-09 MED ORDER — PROMETHAZINE HCL 25 MG/ML IJ SOLN: 6.2500 mg | INTRAMUSCULAR | Status: DC | PRN

## 2015-06-09 MED ORDER — HYDROMORPHONE HCL 1 MG/ML IJ SOLN
0.2500 mg | INTRAMUSCULAR | Status: DC | PRN
Start: 2015-06-09 — End: 2015-06-09

## 2015-06-09 MED ORDER — FENTANYL CITRATE (PF) 250 MCG/5ML IJ SOLN
INTRAMUSCULAR | Status: AC
  Filled 2015-06-09: qty 5

## 2015-06-09 MED ORDER — LIDOCAINE HCL (CARDIAC) 20 MG/ML IV SOLN
INTRAVENOUS | Status: AC
  Filled 2015-06-09: qty 10

## 2015-06-09 MED ORDER — ATROPINE SULFATE 0.4 MG/ML IJ SOLN
INTRAMUSCULAR | Status: AC
  Filled 2015-06-09: qty 1

## 2015-06-09 MED ORDER — ONDANSETRON HCL 4 MG/2ML IJ SOLN
INTRAMUSCULAR | Status: DC | PRN
  Administered 2015-06-09: 4 mg via INTRAVENOUS

## 2015-06-09 MED ORDER — LIDOCAINE HCL (CARDIAC) 20 MG/ML IV SOLN
INTRAVENOUS | Status: DC | PRN
  Administered 2015-06-09: 50 mg via INTRAVENOUS

## 2015-06-09 MED ORDER — 0.9 % SODIUM CHLORIDE (POUR BTL) OPTIME
TOPICAL | Status: DC | PRN
  Administered 2015-06-09: 1000 mL

## 2015-06-09 MED ORDER — ROCURONIUM BROMIDE 100 MG/10ML IV SOLN
INTRAVENOUS | Status: AC
  Filled 2015-06-09: qty 1

## 2015-06-09 MED ORDER — PHENYLEPHRINE HCL 10 MG/ML IJ SOLN
INTRAMUSCULAR | Status: DC | PRN
  Administered 2015-06-09 (×5): 80 ug via INTRAVENOUS

## 2015-06-09 SURGICAL SUPPLY — 46 items
ADH SKN CLS APL DERMABOND .7 (GAUZE/BANDAGES/DRESSINGS) ×1
APL SKNCLS STERI-STRIP NONHPOA (GAUZE/BANDAGES/DRESSINGS) ×1
ATTRACTOMAT 16X20 MAGNETIC DRP (DRAPES) ×3 IMPLANT
BENZOIN TINCTURE PRP APPL 2/3 (GAUZE/BANDAGES/DRESSINGS) ×3 IMPLANT
BLADE HEX COATED 2.75 (ELECTRODE) ×3 IMPLANT
BLADE SURG 15 STRL LF DISP TIS (BLADE) ×2 IMPLANT
BLADE SURG 15 STRL SS (BLADE) ×6
BLADE SURG SZ10 CARB STEEL (BLADE) ×6 IMPLANT
CLIP TI WIDE RED SMALL 6 (CLIP) ×18 IMPLANT
CLOSURE WOUND 1/2 X4 (GAUZE/BANDAGES/DRESSINGS) ×1
COVER SURGICAL LIGHT HANDLE (MISCELLANEOUS) ×3 IMPLANT
DERMABOND ADVANCED (GAUZE/BANDAGES/DRESSINGS) ×2
DERMABOND ADVANCED .7 DNX12 (GAUZE/BANDAGES/DRESSINGS) IMPLANT
DISSECTOR ROUND CHERRY 3/8 STR (MISCELLANEOUS) ×6 IMPLANT
DRAPE LAPAROTOMY T 98X78 PEDS (DRAPES) ×3 IMPLANT
ELECT NDL TIP 2.8 STRL (NEEDLE) IMPLANT
ELECT NEEDLE TIP 2.8 STRL (NEEDLE) IMPLANT
ELECT PENCIL ROCKER SW 15FT (MISCELLANEOUS) ×3 IMPLANT
ELECT REM PT RETURN 9FT ADLT (ELECTROSURGICAL) ×3
ELECTRODE REM PT RTRN 9FT ADLT (ELECTROSURGICAL) ×1 IMPLANT
GAUZE SPONGE 4X4 12PLY STRL (GAUZE/BANDAGES/DRESSINGS) ×3 IMPLANT
GAUZE SPONGE 4X4 16PLY XRAY LF (GAUZE/BANDAGES/DRESSINGS) ×6 IMPLANT
GLOVE BIOGEL PI IND STRL 7.0 (GLOVE) ×1 IMPLANT
GLOVE BIOGEL PI INDICATOR 7.0 (GLOVE) ×2
GLOVE ECLIPSE 8.0 STRL XLNG CF (GLOVE) ×3 IMPLANT
GLOVE INDICATOR 8.0 STRL GRN (GLOVE) ×6 IMPLANT
GOWN STRL REUS W/TWL LRG LVL3 (GOWN DISPOSABLE) ×3 IMPLANT
GOWN STRL REUS W/TWL XL LVL3 (GOWN DISPOSABLE) ×6 IMPLANT
HEMOSTAT SURGICEL 2X14 (HEMOSTASIS) ×3 IMPLANT
KIT BASIN OR (CUSTOM PROCEDURE TRAY) ×3 IMPLANT
MARKER SKIN DUAL TIP RULER LAB (MISCELLANEOUS) ×3 IMPLANT
NS IRRIG 1000ML POUR BTL (IV SOLUTION) ×3 IMPLANT
PACK BASIC VI WITH GOWN DISP (CUSTOM PROCEDURE TRAY) ×3 IMPLANT
STAPLER VISISTAT 35W (STAPLE) ×3 IMPLANT
STRIP CLOSURE SKIN 1/2X4 (GAUZE/BANDAGES/DRESSINGS) ×2 IMPLANT
SUT SILK 2 0 (SUTURE) ×3
SUT SILK 2-0 18XBRD TIE 12 (SUTURE) ×1 IMPLANT
SUT SILK 3 0 (SUTURE)
SUT SILK 3-0 18XBRD TIE 12 (SUTURE) IMPLANT
SUT VIC AB 3-0 SH 27 (SUTURE)
SUT VIC AB 3-0 SH 27XBRD (SUTURE) IMPLANT
SUT VIC AB 4-0 PS2 27 (SUTURE) ×3 IMPLANT
SYR BULB IRRIGATION 50ML (SYRINGE) IMPLANT
TOWEL OR 17X26 10 PK STRL BLUE (TOWEL DISPOSABLE) ×3 IMPLANT
WATER STERILE IRR 1500ML POUR (IV SOLUTION) ×3 IMPLANT
YANKAUER SUCT BULB TIP 10FT TU (MISCELLANEOUS) ×3 IMPLANT

## 2015-06-09 NOTE — Op Note (Signed)
NAMJohny Shock:  [name], Elizabeth [name]               ACCOUNT NO.:  [id number]  MEDICAL RECORD NO.:  [id number]  LOCATION:  [facility]                         [facility]  PHYSICIAN:  Velora Hecklerodd M , MD      DATE OF BIRTH:  27-Nov-1950  DATE OF PROCEDURE:  06/09/2015                              OPERATIVE REPORT   PREOPERATIVE DIAGNOSIS:  Rule out left temporal arteritis, left temporal headache.  POSTOPERATIVE DIAGNOSIS:  Rule out left temporal arteritis, left temporal headache.  PROCEDURE:  Left temporal artery biopsy.  SURGEON:  Velora Hecklerodd M , MD.  ANESTHESIA:  General with local Marcaine.  COMPLICATIONS:  None.  INDICATIONS:  The patient is a 65 year old female referred by her rheumatologist for temporal artery biopsy to rule out temporal arteritis.  BODY OF REPORT:  [facility].  The patient was brought to the operating room, placed in supine position on the operating room table.  Following administration of general anesthesia, the patient was positioned and then prepped and draped in the usual aseptic fashion.  After ascertaining that, an adequate level of anesthesia had been achieved, the left temporal area was scanned with a Doppler to localize the temporal artery.  A 2.5 cm incision was made anterior to the upper pinna with a #15 blade.  Dissection carried through subcutaneous tissues and hemostasis achieved with the electrocautery.  With gentle blunt dissection, the temporal artery was identified.  It was dissected out along its length.  Branches are ligated with 2-0 silk ties and a 2-cm segment of the artery was excised and submitted to Pathology for review. Good hemostasis was noted.  Local anesthetic was infiltrated circumferentially.  Subcutaneous tissues were closed with interrupted 3- 0 Vicryl sutures.  Skin was closed with a running 4-0 Monocryl subcuticular suture.  Dermabond was placed as dressing.  The patient  was awakened from anesthesia and brought to the recovery room.  The patient tolerated the procedure well.   Velora Hecklerodd M. , MD, [facility] Office: [phone]   TMG/MEDQ  D:  06/09/2015  T:  06/09/2015  Job:  [id number]  cc:   Stacie Acresynthia S. Cliffton AstersWhite, M.D. Fax: [phone]  Pollyann SavoyShaili Deveshwar, M.D. Fax: [phone]

## 2015-06-09 NOTE — H&P (Signed)
General Surgery [facility]  Elizabeth [name] 06/04/2015 9:00 AM Location: [facility] Patient #: [id number] DOB: 12/19/1950 Single / Language: [place] / Race: Black or African American Female  History of Present Illness Elizabeth Heckler MD; 06/04/2015 9:24 AM)  Patient is referred by Dr. Pollyann Savoy for temporal artery biopsy to rule out temporal arteritis. Patient's primary care physician is Dr. Laurann Montana. Patient has developed left-sided temporal headaches over the past 2-3 weeks. She notes pressure behind the left eye. She has not had any significant vision changes. Evaluation has included laboratory studies showing an elevated sedimentation rate. Patient has been started on intravenous steroids followed by prednisone 20 mg daily currently. She is now referred for left temporal artery biopsy to rule out temporal arteritis. Patient previously underwent a left partial parotidectomy for benign tumor approximately 30 years ago. She has had no other head or neck surgery.   Other Problems Elizabeth [name], CMA; 06/04/2015 9:00 AM) Arthritis Cholelithiasis High blood pressure Sleep Apnea  Past Surgical History Elizabeth [name], CMA; 06/04/2015 9:00 AM) Gallbladder Surgery - Laparoscopic Hysterectomy (not due to cancer) - Partial Oral Surgery  Diagnostic Studies History Elizabeth [name], CMA; 06/04/2015 9:00 AM) Colonoscopy 1-5 years ago Mammogram 1-3 years ago  Allergies Elizabeth [name], CMA; 06/04/2015 9:00 AM) Imuran *ASSORTED CLASSES*  Medication History Elizabeth [name], CMA; 06/04/2015 9:02 AM) AmLODIPine Besylate (  Tablet, Oral) Active. Vitamin D3 (50000UNIT Capsule, Oral) Active. Hydroxychloroquine Sulfate (  Tablet, Oral) Active. PredniSONE (  Tablet, Oral) Active. PredniSONE (  Tablet, Oral) Active. PredniSONE (  Tablet, Oral) Active. Refresh (1% Solution, Ophthalmic) Active. Albuterol Sulfate HFA (108 (90  Base)MCG/ACT Aerosol Soln, Inhalation) Active. Aspirin (  Tablet, Oral) Active. Medications Reconciled  Social History Elizabeth [name], CMA; 06/04/2015 9:00 AM) Caffeine use Tea. No alcohol use No drug use Tobacco use Never smoker.  Family History Elizabeth [name], [place]; 06/04/2015 9:00 AM) Arthritis Father, Mother. Cerebrovascular Accident Father. Diabetes Mellitus Brother, Mother. Hypertension Father, Mother. Kidney Disease Brother.  Pregnancy / Birth History Elizabeth [name], CMA; 06/04/2015 9:00 AM) Age at menarche 14 years.  Review of Systems Elizabeth [name] CMA; 06/04/2015 9:00 AM) General Present- Weight Gain. Not Present- Appetite Loss, Chills, Fatigue, Fever, Night Sweats and Weight Loss. Skin Present- Dryness. Not Present- Change in Wart/Mole, Hives, Jaundice, New Lesions, Non-Healing Wounds, Rash and Ulcer. HEENT Present- Wears glasses/contact lenses. Not Present- Earache, Hearing Loss, Hoarseness, Nose Bleed, Oral Ulcers, Ringing in the Ears, Seasonal Allergies, Sinus Pain, Sore Throat, Visual Disturbances and Yellow Eyes. Respiratory Present- Difficulty Breathing. Not Present- Bloody sputum, Chronic Cough, Snoring and Wheezing. Breast Not Present- Breast Mass, Breast Pain, Nipple Discharge and Skin Changes. Cardiovascular Present- Shortness of Breath and Swelling of Extremities. Not Present- Chest Pain, Difficulty Breathing Lying Down, Leg Cramps, Palpitations and Rapid Heart Rate. Gastrointestinal Not Present- Abdominal Pain, Bloating, Bloody Stool, Change in Bowel Habits, Chronic diarrhea, Constipation, Difficulty Swallowing, Excessive gas, Gets full quickly at meals, Hemorrhoids, Indigestion, Nausea, Rectal Pain and Vomiting. Female Genitourinary Not Present- Frequency, Nocturia, Painful Urination, Pelvic Pain and Urgency. Musculoskeletal Present- Joint Pain and Joint Stiffness. Not Present- Back Pain, Muscle Pain, Muscle Weakness and Swelling of Extremities. Neurological  Present- Headaches. Not Present- Decreased Memory, Fainting, Numbness, Seizures, Tingling, Tremor, Trouble walking and Weakness. Psychiatric Not Present- Anxiety, Bipolar, Change in Sleep Pattern, Depression, Fearful and Frequent crying. Endocrine Present- Cold Intolerance. Not Present- Excessive Hunger, Hair Changes, Heat Intolerance, Hot flashes and New Diabetes. Hematology Not Present- Easy Bruising, Excessive bleeding, Gland problems, HIV and Persistent Infections.  Vitals Elizabeth [name](Ashley Beck CMA; 06/04/2015 9:02 AM) 06/04/2015 9:02 AM Weight: 256 lb Height: 63.5in Body Surface Area: 2.16 m Body Mass Index: 44.64 kg/m  Temp.: 97.24F  Pulse: 76 (Regular)  BP: 136/78 (Sitting, Left Arm, Standard)  Physical Exam Elizabeth [name]( M.  MD; 06/04/2015 9:21 AM) The physical exam findings are as follows: Note:General - appears comfortable, no distress; not diaphorectic  HEENT - normocephalic; sclerae clear, gaze conjugate; mucous membranes moist, dentition good; voice normal; palpable pulse anterior to left pinna without tenderness  Neck - symmetric on extension; no palpable anterior or posterior cervical adenopathy; no palpable masses in the thyroid bed; well-healed surgical incision at angle of left mandible consistent with previous partial parotidectomy  Chest - clear bilaterally without rhonchi, rales, or wheeze  Cor - regular rhythm with normal rate; no significant murmur  Ext - non-tender without significant edema or lymphedema  Neuro - grossly intact; no tremor  Assessment & Plan Elizabeth [name]( M.  MD; 06/04/2015 9:23 AM)  LEFT TEMPORAL HEADACHE (R51)  Patient presents on referral from her rheumatologist for left temporal artery biopsy to rule out temporal arteritis. Patient has already been started on steroids. They request urgent biopsy.  I reviewed with the patient the procedure of left temporal artery biopsy. We will do this under anesthesia as an outpatient surgical procedure. I  explained the location of the surgical incision in the postoperative wound care. We will forward results of the biopsy to her rheumatologist and primary care physician as soon as they are available. Patient will be scheduled urgently.  The risks and benefits of the procedure have been discussed at length with the patient. The patient understands the proposed procedure, potential alternative treatments, and the course of recovery to be expected. All of the patient's questions have been answered at this time. The patient wishes to proceed with surgery.  Elizabeth Hecklerodd M. , MD, [facility] Office: [phone]

## 2015-06-09 NOTE — Anesthesia Preprocedure Evaluation (Addendum)
Anesthesia Evaluation  Patient identified by MRN, date of birth, ID band Patient awake    Reviewed: Allergy & Precautions, NPO status , Patient's Chart, lab work & pertinent test results  Airway Mallampati: II  TM Distance: >3 FB Neck ROM: Full    Dental  (+) Teeth Intact   Pulmonary sleep apnea ,    breath sounds clear to auscultation       Cardiovascular hypertension, Pt. on medications  Rhythm:Regular Rate:Normal     Neuro/Psych  Headaches, negative psych ROS   GI/Hepatic negative GI ROS, Neg liver ROS,   Endo/Other  negative endocrine ROS  Renal/GU negative Renal ROS  negative genitourinary   Musculoskeletal  (+) Arthritis ,   Abdominal   Peds negative pediatric ROS (+)  Hematology   Anesthesia Other Findings   Reproductive/Obstetrics negative OB ROS                            Lab Results  Component Value Date   WBC 16.3* 06/04/2015   HGB 10.8* 06/04/2015   HCT 35.4* 06/04/2015   MCV 81.8 06/04/2015   PLT 245 06/04/2015   Lab Results  Component Value Date   CREATININE 0.9 06/03/2015   BUN 21.6 06/03/2015   NA 141 06/03/2015   K 3.9 06/03/2015   CL 106 05/27/2015   CO2 25 06/03/2015   No results found for: INR, PROTIME  05/2015 EKG: normal sinus rhythm.   Anesthesia Physical Anesthesia Plan  ASA: III  Anesthesia Plan: General   Post-op Pain Management:    Induction: Intravenous  Airway Management Planned: Oral ETT  Additional Equipment:   Intra-op Plan:   Post-operative Plan: Extubation in OR  Informed Consent: I have reviewed the patients History and Physical, chart, labs and discussed the procedure including the risks, benefits and alternatives for the proposed anesthesia with the patient or authorized representative who has indicated his/her understanding and acceptance.   Dental advisory given  Plan Discussed with: CRNA  Anesthesia Plan Comments:          Anesthesia Quick Evaluation

## 2015-06-09 NOTE — Brief Op Note (Signed)
06/09/2015  9:37 AM  PATIENT:  Elizabeth FredricksonMarchia A Caba  65 y.o. female  PRE-OPERATIVE DIAGNOSIS:  rule out temporal arteritis, left temporal headache  POST-OPERATIVE DIAGNOSIS:  rule out temporal arteritis, left temporal headache  PROCEDURE:  Procedure(s): LEFT TEMPORAL ARTERY BIOPSY (Left)  SURGEON:  Surgeon(s) and Role:    * Darnell Levelodd , MD - Primary  ANESTHESIA:   general  EBL:  Total I/O In: 800 [I.V.:800] Out: 4 [Blood:4]  BLOOD ADMINISTERED:none  DRAINS: none   LOCAL MEDICATIONS USED:  MARCAINE     SPECIMEN:  Excision  DISPOSITION OF SPECIMEN:  PATHOLOGY  COUNTS:  YES  TOURNIQUET:  * No tourniquets in log *  DICTATION: .Other Dictation: Dictation Number [id number]  PLAN OF CARE: Discharge to home after PACU  PATIENT DISPOSITION:  PACU - hemodynamically stable.   Delay start of Pharmacological VTE agent (>24hrs) due to surgical blood loss or risk of bleeding: yes  Velora Hecklerodd M. , MD, [facility] Office: [phone]

## 2015-06-09 NOTE — Transfer of Care (Signed)
Immediate Anesthesia Transfer of Care Note  Patient: Elizabeth [name]  Procedure(s) Performed: Procedure(s): LEFT TEMPORAL ARTERY BIOPSY (Left)  Patient Location: PACU  Anesthesia Type:General  Level of Consciousness:  sedated, patient cooperative and responds to stimulation  Airway & Oxygen Therapy:Patient Spontanous Breathing and Patient connected to face mask oxgen  Post-op Assessment:  Report given to PACU RN and Post -op Vital signs reviewed and stable  Post vital signs:  Reviewed and stable  Last Vitals:  Filed Vitals:   06/09/15 0640  BP: 161/75  Pulse: 51  Temp: 36.4 C  Resp: 16    Complications: No apparent anesthesia complications

## 2015-06-09 NOTE — Anesthesia Postprocedure Evaluation (Signed)
Anesthesia Post Note  Patient: Nikolette A Enis  Procedure(s) Performed: Procedure(s) (LRB): LEFT TEMPORAL ARTERY BIOPSY (Left)  Patient location during evaluation: PACU Anesthesia Type: General Level of consciousness: awake and alert Pain management: pain level controlled Vital Signs Assessment: post-procedure vital signs reviewed and stable Respiratory status: spontaneous breathing, nonlabored ventilation, respiratory function stable and patient connected to nasal cannula oxygen Cardiovascular status: blood pressure returned to baseline and stable Postop Assessment: no signs of nausea or vomiting Anesthetic complications: no    Last Vitals:  Filed Vitals:   06/09/15 1015 06/09/15 1028  BP: 130/63 147/58  Pulse: 56 55  Temp:  36.4 C  Resp: 21 20    Last Pain:  Filed Vitals:   06/09/15 1029  PainSc: 0-No pain                 Shelton SilvasKevin D 

## 2015-06-09 NOTE — Anesthesia Procedure Notes (Signed)
Procedure Name: Intubation Date/Time: 06/09/2015 8:43 AM Performed by: , Nuala AlphaKRISTOPHER Pre-anesthesia Checklist: Patient identified, Emergency Drugs available, Suction available, Patient being monitored and Timeout performed Patient Re-evaluated:Patient Re-evaluated prior to inductionOxygen Delivery Method: Circle system utilized Preoxygenation: Pre-oxygenation with 100% oxygen Intubation Type: IV induction Ventilation: Mask ventilation without difficulty Laryngoscope Size: Mac and 3 Grade View: Grade II Tube type: Oral Tube size: 7.0 mm Number of attempts: 1 Airway Equipment and Method: Stylet Placement Confirmation: ETT inserted through vocal cords under direct vision,  positive ETCO2,  CO2 detector and breath sounds checked- equal and bilateral Secured at: 22 cm Tube secured with: Tape Dental Injury: Teeth and Oropharynx as per pre-operative assessment

## 2015-06-10 ENCOUNTER — Other Ambulatory Visit (HOSPITAL_COMMUNITY): Payer: Self-pay | Admitting: Rheumatology

## 2015-06-11 ENCOUNTER — Other Ambulatory Visit (HOSPITAL_COMMUNITY): Payer: Self-pay | Admitting: Rheumatology

## 2015-06-11 NOTE — Progress Notes (Signed)
Quick Note:  Please contact patient and notify of benign pathology results.   M. , MD, [facility] Office: [phone]   ______ 

## 2015-06-12 ENCOUNTER — Other Ambulatory Visit (HOSPITAL_COMMUNITY): Payer: Self-pay | Admitting: Rheumatology

## 2015-06-15 ENCOUNTER — Other Ambulatory Visit (HOSPITAL_COMMUNITY): Payer: Self-pay | Admitting: Rheumatology

## 2015-06-15 DIAGNOSIS — M316 Other giant cell arteritis: Secondary | ICD-10-CM

## 2015-06-24 ENCOUNTER — Ambulatory Visit (HOSPITAL_COMMUNITY)
Admission: RE | Admit: 2015-06-24 | Discharge: 2015-06-24 | Disposition: A | Discharge status: Home / Self Care | Payer: Federal, State, Local not specified - PPO | Source: Ambulatory Visit | Attending: Rheumatology | Admitting: Rheumatology

## 2015-06-24 DIAGNOSIS — M316 Other giant cell arteritis: Secondary | ICD-10-CM

## 2015-06-24 MED ORDER — GADOBENATE DIMEGLUMINE 529 MG/ML IV SOLN
20.0000 mL | Freq: Once | INTRAVENOUS | Status: AC | PRN
  Administered 2015-06-24: 20 mL via INTRAVENOUS

## 2015-08-26 ENCOUNTER — Encounter: Payer: Self-pay | Admitting: Pulmonary Disease

## 2015-08-26 ENCOUNTER — Ambulatory Visit (INDEPENDENT_AMBULATORY_CARE_PROVIDER_SITE_OTHER): Payer: Federal, State, Local not specified - PPO | Admitting: Pulmonary Disease

## 2015-08-26 VITALS — BP 144/82 | HR 66 | Ht 63.5 in | Wt 282.6 lb

## 2015-08-26 DIAGNOSIS — G4733 Obstructive sleep apnea (adult) (pediatric): Secondary | ICD-10-CM | POA: Diagnosis not present

## 2015-08-26 DIAGNOSIS — M359 Systemic involvement of connective tissue, unspecified: Secondary | ICD-10-CM | POA: Diagnosis not present

## 2015-08-26 DIAGNOSIS — M3500 Sicca syndrome, unspecified: Secondary | ICD-10-CM

## 2015-08-26 DIAGNOSIS — R053 Chronic cough: Secondary | ICD-10-CM

## 2015-08-26 DIAGNOSIS — R05 Cough: Secondary | ICD-10-CM | POA: Diagnosis not present

## 2015-08-26 DIAGNOSIS — Z8669 Personal history of other diseases of the nervous system and sense organs: Secondary | ICD-10-CM

## 2015-08-26 NOTE — Progress Notes (Signed)
History of Present Illness Elizabeth [name] is a 65 y.o. female with Sjogren's Syndrome and OSA , not using CPAP,followed by Dr. Halford Chessman.   6/7/2017Follow up: Pt. Presents to the office for follow up. She states her dyspnea  has been stable.No complaints of sinus issues, cough or congestion.She uses Claritin when she needs it. She has recently been started on Azathioprine and prednisone for temporal arteritis.Her breathing is stable. She has not had to use her rescue inhaler.She is not wearing her CPAP machine.She states that she felt the machine was causing respiratory issues so she stopped using it..  Tests CT Chest 12/2014: IMPRESSION: 1. Interval resolution of the bibasilar tiny nodules seen on the most recent comparison study. Basilar atelectasis was more prominent on that exam, likely accounting for the nodularity. 2. Stable borderline to mild bilateral subpectoral and axillary lymphadenopathy comparing back to 05/31/2012. This could potentially be related to the patient's reported history of Sjogren syndrome.  Lexiscan myocardial perfusion study 08/26/11 >> no ischemia PSG 08/28/11 >> AHI 10.4, SpO2 low 82% Echo 08/31/11 >> EF 60%, mild/mod LVH Labs 01/13/12 >> ANA >1:2560, Smith Ab 249, Sm/RNP 155 CT chest 05/31/12 >> b/l axillary nodes up to 1.7 cm on Rt, subtle sub-pleural reticulation b/l periphery of lower lobes, minimal air-trapping, hepatic steatosis PFT 06/20/12 >> FEV1 1.45 (68%), FEV1% 82, TLC 4.14 (80%), DLCO 51%, no BD Labs 08/02/12 >> ESR 55, ANA >1:2560 6MWT 10/10/12 >> 450 meters CT chest 08/13/13 >> no ASD  Past medical hx Past Medical History  Diagnosis Date  . Abnormal EKG   . Hypertension   . Sjogren's syndrome ([place]) 03/22/2011    [place]  . OSA (obstructive sleep apnea)     CPAP- has not used 6 months to a year since used   . Shortness of breath dyspnea     with exertion   . Headache     left sided headache   . Arthritis   . Anemia     slight   .  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  Current Outpatient Prescriptions on File Prior to Visit  Medication Sig  . albuterol (PROVENTIL HFA;VENTOLIN HFA) 108 (90 BASE) MCG/ACT inhaler Inhale 1-2 puffs into the lungs every 6 (six) hours as needed for wheezing or shortness of breath.  Marland Kitchen aspirin 81 MG tablet Take 81 mg by mouth daily.  . hydroxychloroquine (PLAQUENIL) 200 MG tablet Take 100-200 mg by mouth See admin instructions. Take 1 tablet in the morning then half a tablet a night  . Polyvinyl Alcohol-Povidone (REFRESH OP) Place 1 drop into both eyes daily as needed. For dry eyes   No current facility-administered medications on file prior to visit.     Allergies  Allergen Reactions  . Food Other (See Comments)    Strawberries: itching   . Imuran [Azathioprine] Other (See Comments)    Made me hurt all over     Review Of Systems:  Constitutional:   No  weight loss, night sweats,  Fevers, chills, fatigue, or  Lassitude.+ Weight gain with prednisone  HEENT:   No headaches,  Difficulty swallowing,  Tooth/dental problems, or  Sore throat,                No sneezing, itching, ear ache, nasal congestion, post nasal drip,   CV:  No chest pain,  Orthopnea, PND, swelling in lower extremities, anasarca,  dizziness, palpitations, syncope.   GI  No heartburn, indigestion, abdominal pain, nausea, vomiting, diarrhea, change in bowel habits, loss of appetite, bloody stools.   Resp: No shortness of breath with exertion or at rest.  No excess mucus, no productive cough,  No non-productive cough,  No coughing up of blood.  No change in color of mucus.  No wheezing.  No chest wall deformity  Skin: no rash or lesions.  GU: no dysuria, change in color of urine, no urgency or frequency.  No flank pain, no hematuria   MS:  No joint pain or swelling.  No decreased range of motion.  No back pain.  Psych:  No change in mood or  affect. No depression or anxiety.  No memory loss.   Vital Signs BP 144/82 mmHg  Pulse 66  Ht 5' 3.5" (1.613 m)  Wt 282 lb 9.6 oz (128.187 kg)  BMI 49.27 kg/m2  SpO2 97%   Physical Exam:  General- No distress,  A&Ox3 ENT: No sinus tenderness, TM clear, pale nasal mucosa, no oral exudate,no post nasal drip, no LAN Cardiac: S1, S2, regular rate and rhythm, no murmur Chest: No wheeze/ rales/ dullness; no accessory muscle use, no nasal flaring, no sternal retractions Abd.: Soft Non-tender Ext: No clubbing cyanosis, edema Neuro:  normal strength Skin: No rashes, warm and dry Psych: normal mood and behavior   Assessment/Plan  Autoimmune disease Follow up for Sjogrens Syndrome Stable 6 month interval Plan:  Your breathing appears to be stable. We will prescribe Mycelex troches for thrush. Take 1 tablet 5 times daily for 1 week. Follow up with Dr. Halford Chessman in 6 months. Please contact office for sooner follow up if symptoms do not improve or worsen or seek emergency care       Elizabeth Spatz, NP 08/26/2015  2:51 PM  She is on prednisone and restarted imuran.  She has regained weight, but doesn't feel like her sleep has gotten any worse >> not using CPAP.  She is not having cough, wheeze, sputum, fever, chest pain or dyspnea.  Moon facies.  Mild thrush.  No wheeze.  HR regular.  No edema.  Assessment/plan:  Chronic cough. - stable - prn albuterol  Sjogren's, Temporal arteritis. - prednisone, imuran per rheumatology - no clinical evidence for significant lung disease >> monitor clinically for now  Hx of obstructive sleep apnea. - monitor clinically  Chesley Mires, MD [place] 08/26/2015, 3:24 PM Pager:  [phone] After 3pm call: [phone]

## 2015-08-26 NOTE — Patient Instructions (Addendum)
It is nice to meet you today. Your breathing appears to be stable. We will prescribe Mycelex troches for thrush. Take 1 tablet 5 times daily for 1 week. Follow up with Dr. Craige CottaSood in 6 months. Please contact office for sooner follow up if symptoms do not improve or worsen or seek emergency care

## 2015-08-26 NOTE — Assessment & Plan Note (Signed)
Follow up for Sjogrens Syndrome Stable 6 month interval Plan:  Your breathing appears to be stable. We will prescribe Mycelex troches for thrush. Take 1 tablet 5 times daily for 1 week. Follow up with Dr. Craige CottaSood in 6 months. Please contact office for sooner follow up if symptoms do not improve or worsen or seek emergency care

## 2015-08-27 ENCOUNTER — Telehealth: Payer: Self-pay | Admitting: Pulmonary Disease

## 2015-08-27 MED ORDER — CLOTRIMAZOLE 10 MG MT TROC: 10.0000 mg | Freq: Every day | OROMUCOSAL | Status: DC

## 2015-08-27 NOTE — Telephone Encounter (Signed)
   Patient Instructions     It is nice to meet you today. Your breathing appears to be stable. We will prescribe Mycelex troches for thrush. Take 1 tablet 5 times daily for 1 week. Follow up with Dr. Craige CottaSood in 6 months. Please contact office for sooner follow up if symptoms do not improve or worsen or seek emergency care    Rx called into pharmacy per pt request. Apologized for the inconvenience and delay in getting her Rx. Expressed understanding. Nothing further needed.

## 2016-12-09 DIAGNOSIS — M359 Systemic involvement of connective tissue, unspecified: Secondary | ICD-10-CM | POA: Diagnosis not present

## 2016-12-09 DIAGNOSIS — M159 Polyosteoarthritis, unspecified: Secondary | ICD-10-CM | POA: Diagnosis not present

## 2016-12-09 DIAGNOSIS — Z79899 Other long term (current) drug therapy: Secondary | ICD-10-CM | POA: Diagnosis not present

## 2016-12-09 DIAGNOSIS — Z8639 Personal history of other endocrine, nutritional and metabolic disease: Secondary | ICD-10-CM | POA: Diagnosis not present

## 2016-12-09 DIAGNOSIS — R700 Elevated erythrocyte sedimentation rate: Secondary | ICD-10-CM | POA: Diagnosis not present

## 2016-12-26 DIAGNOSIS — Z23 Encounter for immunization: Secondary | ICD-10-CM | POA: Diagnosis not present

## 2016-12-26 DIAGNOSIS — R0789 Other chest pain: Secondary | ICD-10-CM | POA: Diagnosis not present

## 2016-12-26 DIAGNOSIS — D649 Anemia, unspecified: Secondary | ICD-10-CM | POA: Diagnosis not present

## 2016-12-26 DIAGNOSIS — I10 Essential (primary) hypertension: Secondary | ICD-10-CM | POA: Diagnosis not present

## 2017-01-16 DIAGNOSIS — J069 Acute upper respiratory infection, unspecified: Secondary | ICD-10-CM | POA: Diagnosis not present

## 2017-02-17 DIAGNOSIS — H40053 Ocular hypertension, bilateral: Secondary | ICD-10-CM | POA: Diagnosis not present

## 2017-02-17 DIAGNOSIS — H25013 Cortical age-related cataract, bilateral: Secondary | ICD-10-CM | POA: Diagnosis not present

## 2017-02-17 DIAGNOSIS — H40013 Open angle with borderline findings, low risk, bilateral: Secondary | ICD-10-CM | POA: Diagnosis not present

## 2017-02-17 DIAGNOSIS — H2513 Age-related nuclear cataract, bilateral: Secondary | ICD-10-CM | POA: Diagnosis not present

## 2017-03-07 DIAGNOSIS — I10 Essential (primary) hypertension: Secondary | ICD-10-CM | POA: Diagnosis not present

## 2017-03-07 DIAGNOSIS — Z6841 Body Mass Index (BMI) 40.0 and over, adult: Secondary | ICD-10-CM | POA: Diagnosis not present

## 2017-03-07 DIAGNOSIS — L819 Disorder of pigmentation, unspecified: Secondary | ICD-10-CM | POA: Diagnosis not present

## 2017-03-07 DIAGNOSIS — D649 Anemia, unspecified: Secondary | ICD-10-CM | POA: Diagnosis not present

## 2017-03-07 DIAGNOSIS — G4730 Sleep apnea, unspecified: Secondary | ICD-10-CM | POA: Diagnosis not present

## 2017-04-06 DIAGNOSIS — D638 Anemia in other chronic diseases classified elsewhere: Secondary | ICD-10-CM | POA: Diagnosis not present

## 2017-04-06 DIAGNOSIS — M159 Polyosteoarthritis, unspecified: Secondary | ICD-10-CM | POA: Diagnosis not present

## 2017-04-06 DIAGNOSIS — M810 Age-related osteoporosis without current pathological fracture: Secondary | ICD-10-CM | POA: Diagnosis not present

## 2017-04-06 DIAGNOSIS — Z79899 Other long term (current) drug therapy: Secondary | ICD-10-CM | POA: Diagnosis not present

## 2017-04-06 DIAGNOSIS — M359 Systemic involvement of connective tissue, unspecified: Secondary | ICD-10-CM | POA: Diagnosis not present

## 2017-05-08 DIAGNOSIS — G4733 Obstructive sleep apnea (adult) (pediatric): Secondary | ICD-10-CM | POA: Diagnosis not present

## 2017-05-15 DIAGNOSIS — Z1231 Encounter for screening mammogram for malignant neoplasm of breast: Secondary | ICD-10-CM | POA: Diagnosis not present

## 2017-06-06 DIAGNOSIS — G4733 Obstructive sleep apnea (adult) (pediatric): Secondary | ICD-10-CM | POA: Diagnosis not present

## 2017-06-08 ENCOUNTER — Encounter (INDEPENDENT_AMBULATORY_CARE_PROVIDER_SITE_OTHER): Payer: Federal, State, Local not specified - PPO

## 2017-06-09 DIAGNOSIS — F325 Major depressive disorder, single episode, in full remission: Secondary | ICD-10-CM | POA: Diagnosis not present

## 2017-06-09 DIAGNOSIS — M359 Systemic involvement of connective tissue, unspecified: Secondary | ICD-10-CM | POA: Diagnosis not present

## 2017-06-09 DIAGNOSIS — J301 Allergic rhinitis due to pollen: Secondary | ICD-10-CM | POA: Diagnosis not present

## 2017-06-09 DIAGNOSIS — I10 Essential (primary) hypertension: Secondary | ICD-10-CM | POA: Diagnosis not present

## 2017-06-12 ENCOUNTER — Ambulatory Visit (INDEPENDENT_AMBULATORY_CARE_PROVIDER_SITE_OTHER): Payer: PPO | Admitting: Family Medicine

## 2017-06-12 ENCOUNTER — Encounter (INDEPENDENT_AMBULATORY_CARE_PROVIDER_SITE_OTHER): Payer: Self-pay | Admitting: Family Medicine

## 2017-06-12 VITALS — BP 147/83 | HR 63 | Temp 98.2°F | Ht 63.0 in | Wt 258.0 lb

## 2017-06-12 DIAGNOSIS — Z1331 Encounter for screening for depression: Secondary | ICD-10-CM | POA: Diagnosis not present

## 2017-06-12 DIAGNOSIS — R0602 Shortness of breath: Secondary | ICD-10-CM

## 2017-06-12 DIAGNOSIS — E559 Vitamin D deficiency, unspecified: Secondary | ICD-10-CM | POA: Diagnosis not present

## 2017-06-12 DIAGNOSIS — I10 Essential (primary) hypertension: Secondary | ICD-10-CM | POA: Diagnosis not present

## 2017-06-12 DIAGNOSIS — Z0289 Encounter for other administrative examinations: Secondary | ICD-10-CM

## 2017-06-12 DIAGNOSIS — R5383 Other fatigue: Secondary | ICD-10-CM

## 2017-06-12 DIAGNOSIS — Z6841 Body Mass Index (BMI) 40.0 and over, adult: Secondary | ICD-10-CM | POA: Diagnosis not present

## 2017-06-12 DIAGNOSIS — Z9189 Other specified personal risk factors, not elsewhere classified: Secondary | ICD-10-CM

## 2017-06-12 NOTE — Progress Notes (Signed)
.  Office: [phone]  /  Fax: [phone]   HPI:   Chief Complaint: OBESITY  Elizabeth [name] (MR# [id number]) is a 67 y.o. female who presents on 06/12/2017 for obesity evaluation and treatment. Current BMI is Body mass index is 45.7 kg/m.Marland Kitchen Elizabeth [name] has struggled with obesity for years and has been unsuccessful in either losing weight or maintaining long term weight loss. Elizabeth [name] found out about our clinic online. Elizabeth [name] attended our information session and states she is currently in the action stage of change and ready to dedicate time achieving and maintaining a healthier weight.  Elizabeth [name] states her desired weight loss is 103 to 113 lbs she started gaining weight approximately in 2000 or 2001 her heaviest weight ever was 300 lbs. she has significant food cravings issues  she is frequently drinking liquids with calories she frequently makes poor food choices she frequently eats larger portions than normal  she has binge eating behaviors she struggles with emotional eating    Fatigue Elizabeth [name] feels her energy is lower than it should be. This has worsened with weight gain and has not worsened recently. Elizabeth [name] admits to daytime somnolence and  admits to waking up still tired. Patient has a diagnosis of obstructive sleep apnea which may be contributing to her fatigue. She was previously on CPAP 5 years ago and then off of CPAP for the past 2 years. She had a recent repeat sleep study and is getting fitted in 3 days for a new CPAP. Patent has a history of symptoms of daytime fatigue, morning fatigue and hypertension. Patient generally gets 6 or 7 hours of sleep per night, and states they generally have restful sleep. Snoring is present. Apneic episodes are present. Epworth Sleepiness Score is 9  EKG was ordered today and was within normal limits.  Dyspnea on exertion Elizabeth [name] notes increasing shortness of breath with exercising and seems to be worsening over time with weight gain. She notes  getting out of breath sooner with activity than she used to. This has not gotten worse recently. She had a recent repeat sleep study and is getting fitted in 3 days for a new CPAP. Elizabeth [name] denies orthopnea.  Vitamin D deficiency Elizabeth [name] has a diagnosis of vitamin D deficiency. She is currently taking OTC vit D and denies nausea, vomiting or muscle weakness.  At risk for osteopenia and osteoporosis Elizabeth [name] is at higher risk of osteopenia and osteoporosis due to vitamin D deficiency.   Hypertension Elizabeth [name] is a 67 y.o. female with hypertension. Her blood pressure is slightly elevated today. EKG was ordered today and was within normal limits. Elizabeth [name] denies chest pain, chest pressure or headache. She is attempting to work on weight loss to help control her blood pressure with the goal of decreasing her risk of heart attack and stroke. Elizabeth [name] blood pressure is not currently controlled.  Depression Screen Elizabeth [name]'s Food and Mood (modified PHQ-9) score was  Depression screen PHQ 2/9 06/12/2017  Decreased Interest 2  Down, Depressed, Hopeless 1  PHQ - 2 Score 3  Altered sleeping 1  Tired, decreased energy 3  Change in appetite 2  Feeling bad or failure about yourself  0  Trouble concentrating 1  Moving slowly or fidgety/restless 1  Suicidal thoughts 0  PHQ-9 Score 11  Difficult doing work/chores Somewhat difficult    ALLERGIES: Allergies  Allergen Reactions  . Food Other (See Comments)    Strawberries: itching   . Imuran [Azathioprine] Other (See Comments)  Made me hurt all over     MEDICATIONS: Current Outpatient Medications on File Prior to Visit  Medication Sig Dispense Refill  . amLODipine (NORVASC) 5 MG tablet Take 5 mg by mouth daily.    Marland Kitchen aspirin 81 MG tablet Take 81 mg by mouth daily.    . Cholecalciferol (VITAMIN D3) 2000 units TABS Take 2,000 Units by mouth daily.    . fluticasone (FLONASE) 50 MCG/ACT nasal spray Place 2 sprays into both nostrils  daily.    . hydroxychloroquine (PLAQUENIL) 200 MG tablet Take 200 mg by mouth 2 (two) times daily. Take 1 tablet in the morning then half a tablet a night     . meloxicam (MOBIC) 7.5 MG tablet Take 7.5 mg by mouth daily as needed for pain.    . Multiple Vitamins-Minerals (MULTIVITAMIN WOMEN 50+) TABS Take 1 tablet by mouth daily.    Marland Kitchen telmisartan-hydrochlorothiazide (MICARDIS HCT) 40-12.5 MG tablet Take 1 tablet by mouth daily.    . Turmeric 400 MG CAPS Take 1 capsule by mouth daily.     No current facility-administered medications on file prior to visit.     PAST MEDICAL HISTORY: Past Medical History:  Diagnosis Date  . Abnormal EKG   . Anemia    slight   . Arthritis   . 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 Back pain   . Depression   . Gallbladder problem   . Headache    left sided headache   . Hypertension   . Joint pain   . Obesity   . OSA (obstructive sleep apnea)    CPAP- has not used 6 months to a year since used   . Osteoarthritis   . Shortness of breath dyspnea    with exertion   . Sjogren's syndrome (HCC) 03/22/2011   [place]  . Undifferentiated connective tissue disease (HCC)   . Vitamin D deficiency     PAST SURGICAL HISTORY: Past Surgical History:  Procedure Laterality Date  . ABDOMINAL HYSTERECTOMY    . ARTERY BIOPSY Left 06/09/2015   Procedure: LEFT TEMPORAL ARTERY BIOPSY;  Surgeon: Darnell Level, MD;  Location: [facility];  Service: General;  Laterality: Left;  . CHOLECYSTECTOMY    . paratoid tumor removed      SOCIAL HISTORY: Social History   Tobacco Use  . Smoking status: Never Smoker  . Smokeless tobacco: Never Used  Substance Use Topics  . Alcohol use: No  . Drug use: No    FAMILY HISTORY: Family History  Problem Relation Age of Onset  . Arthritis Mother   . Diabetes Mother   . Hypertension Mother   . Stroke Mother   . Obesity Mother   . Heart disease Father   . Hypertension Father   . Stroke Father   . Obesity  Father   . Sleep apnea Father   . Hypertension Brother   . Asthma Paternal Grandmother     ROS: Review of Systems  Constitutional: Positive for malaise/fatigue.  HENT:       Dry Mouth  Eyes:       Vision Changes Wear Glasses or Contacts' Floaters  Respiratory: Positive for shortness of breath (on exertion).   Cardiovascular: Negative for chest pain and orthopnea.       Negative for Chest Pressure  Gastrointestinal: Negative for nausea and vomiting.  Musculoskeletal:       Muscle or Joint Pain Negative for muscle weakness  Skin: Positive for rash.  Dryness   Neurological: Negative for headaches.    PHYSICAL EXAM: Blood pressure (!) 147/83, pulse 63, temperature 98.2 F (36.8 C), temperature source Oral, height 5\' 3"  (1.6 m), weight 258 lb (117 kg), SpO2 99 %. Body mass index is 45.7 kg/m. Physical Exam  Constitutional: She is oriented to person, place, and time. She appears well-developed and well-nourished.  HENT:  Head: Normocephalic and atraumatic.  Nose: Nose normal.  Eyes: EOM are normal. No scleral icterus.  Neck: Normal range of motion. Neck supple. No thyromegaly present.  Cardiovascular: Normal rate and regular rhythm.  Pulmonary/Chest: Effort normal. No respiratory distress.  Abdominal: Soft. There is no tenderness.  + obesity  Musculoskeletal: Normal range of motion.  Range of Motion normal in all 4 extremities  Neurological: She is alert and oriented to person, place, and time. Coordination normal.  Skin: Skin is warm and dry.  Positive for acanthosis nigricans Positive for skin tags  Psychiatric: She has a normal mood and affect. Her behavior is normal.  Vitals reviewed.   RECENT LABS AND TESTS: BMET    Component Value Date/Time   NA 141 06/03/2015 1459   K 3.9 06/03/2015 1459   CL 106 05/27/2015 0211   CO2 25 06/03/2015 1459   GLUCOSE 149 (H) 06/03/2015 1459   BUN 21.6 06/03/2015 1459   CREATININE 0.9 06/03/2015 1459   CALCIUM 8.3 (L)  06/03/2015 1459   GFRNONAA >60 05/27/2015 0211   GFRAA >60 05/27/2015 0211   No results found for: HGBA1C No results found for: INSULIN CBC    Component Value Date/Time   WBC 16.3 (H) 06/04/2015 1335   RBC 4.33 06/04/2015 1335   HGB 10.8 (L) 06/04/2015 1335   HGB 11.0 (L) 06/03/2015 1459   HCT 35.4 (L) 06/04/2015 1335   HCT 34.3 (L) 06/03/2015 1459   PLT 245 06/04/2015 1335   PLT 229 Large platelets present 06/03/2015 1459   MCV 81.8 06/04/2015 1335   MCV 79.2 (L) 06/03/2015 1459   MCH 24.9 (L) 06/04/2015 1335   MCHC 30.5 06/04/2015 1335   RDW 19.3 (H) 06/04/2015 1335   RDW 18.6 (H) 06/03/2015 1459   LYMPHSABS 1.8 06/03/2015 1459   MONOABS 0.9 06/03/2015 1459   EOSABS 0.0 06/03/2015 1459   BASOSABS 0.0 06/03/2015 1459   Iron/TIBC/Ferritin/ %Sat    Component Value Date/Time   IRON 63 06/03/2015 1459   TIBC 230 (L) 06/03/2015 1459   FERRITIN 674 (H) 06/03/2015 1459   IRONPCTSAT 27 06/03/2015 1459   Lipid Panel  No results found for: CHOL, TRIG, HDL, CHOLHDL, VLDL, LDLCALC, LDLDIRECT Hepatic Function Panel     Component Value Date/Time   PROT 7.5 06/03/2015 1459   PROT 7.0 06/03/2015 1459   ALBUMIN 3.0 (L) 06/03/2015 1459   AST 15 06/03/2015 1459   ALT 33 06/03/2015 1459   ALKPHOS 53 06/03/2015 1459   BILITOT <0.30 06/03/2015 1459   No results found for: TSH Vitamin D There are no recent lab results  ECG  shows NSR with a rate of 67 BPM INDIRECT CALORIMETER done today shows a VO2 of 248 and a REE of 1728. Her calculated basal metabolic rate is 1914 thus her basal metabolic rate is worse than expected.    ASSESSMENT AND PLAN: Other fatigue - Plan: EKG 12-Lead, Hemoglobin A1c, Insulin, random, Vitamin B12, Folate, T3, T4, free, TSH  Shortness of breath on exertion  Vitamin D deficiency - Plan: VITAMIN D 25 Hydroxy (Vit-D Deficiency, Fractures)  Essential hypertension - Plan:  CBC with Differential/Platelet, Comprehensive metabolic panel, Lipid  panel  Depression screening  At risk for osteoporosis  Class 3 severe obesity with serious comorbidity and body mass index (BMI) of 45.0 to 49.9 in adult, unspecified obesity type (HCC)  PLAN:  Fatigue Elizabeth [name] was informed that her fatigue may be related to obesity, depression or many other causes. Labs will be ordered, and in the meanwhile Elizabeth [name] has agreed to work on diet, exercise and weight loss to help with fatigue. Proper sleep hygiene was discussed including the need for 7-8 hours of quality sleep each night. A sleep study was not ordered. We will order indirect calorimetry.  Dyspnea on exertion Elizabeth [name]'s shortness of breath appears to be obesity related and exercise induced. She has agreed to work on weight loss and gradually increase exercise to treat her exercise induced shortness of breath. If Elizabeth [name] follows our instructions and loses weight without improvement of her shortness of breath, we will plan to refer to pulmonology. We will order indirect calorimetry and labs. We will monitor this condition regularly. Elizabeth [name] agrees to this plan.  Vitamin D Deficiency Elizabeth [name] was informed that low vitamin D levels contributes to fatigue and are associated with obesity, breast, and colon cancer. She agrees to continue to take OTC vitamin D supplement daily. We will check vitamin D level and she will follow up for routine testing of vitamin D, at least 2-3 times per year. She was informed of the risk of over-replacement of vitamin D and agrees to not increase her dose unless she discusses this with [place] first.  At risk for osteopenia and osteoporosis Elizabeth [name] is at risk for osteopenia and osteoporosis due to her vitamin D deficiency. She was encouraged to take her vitamin D and follow her higher calcium diet and increase strengthening exercise to help strengthen her bones and decrease her risk of osteopenia and osteoporosis.  Hypertension We discussed sodium restriction, working on healthy  weight loss, and a regular exercise program as the means to achieve improved blood pressure control. Elizabeth [name] agreed with this plan and agreed to follow up as directed. We will continue to monitor her blood pressure as well as her progress with the above lifestyle modifications. She will continue her medications as prescribed and will watch for signs of hypotension as she continues her lifestyle modifications. We will check CMP and Elizabeth [name] agrees to follow up as directed.  Depression Screen Elizabeth [name] had a moderately positive depression screening. Depression is commonly associated with obesity and often results in emotional eating behaviors. We will monitor this closely and work on CBT to help improve the non-hunger eating patterns. Referral to Psychology may be required if no improvement is seen as she continues in our clinic.  Obesity Elizabeth [name] is currently in the action stage of change and her goal is to continue with weight loss efforts She has agreed to follow the Category 3 plan Elizabeth [name] has been instructed to work up to a goal of 150 minutes of combined cardio and strengthening exercise per week for weight loss and overall health benefits. We discussed the following Behavioral Modification Strategies today: better snacking choices, increasing lean protein intake, work on meal planning and easy cooking plans and ways to avoid boredom eating  Shaylene has agreed to follow up with our clinic in 2 weeks. She was informed of the importance of frequent follow up visits to maximize her success with intensive lifestyle modifications for her multiple health conditions. She was informed we would discuss her lab results at her  next visit unless there is a critical issue that needs to be addressed sooner. [place] agreed to keep her next visit at the agreed upon time to discuss these results.    OBESITY BEHAVIORAL INTERVENTION VISIT  Today's visit was # 1 out of 22.  Starting weight: 258 lbs Starting date:  06/12/17 Today's weight : 258 lbs Today's date: 06/12/2017 Total lbs lost to date: 0 (Patients must lose 7 lbs in the first 6 months to continue with counseling)   ASK: We discussed the diagnosis of obesity with Hilarie FredricksonMarchia A Gartner today and Wilson agreed to give us permission to discuss obesity behavioral modification therapy today.  ASSESS: [name] has the diagnosis of obesity and her BMI today is 45.71 Laurielle is in the action stage of change   ADVISE: [name] was educated on the multiple health risks of obesity as well as the benefit of weight loss to improve her health. She was advised of the need for long term treatment and the importance of lifestyle modifications.  AGREE: Multiple dietary modification options and treatment options were discussed and  Alynn agreed to the above obesity treatment plan.   I, [place] CraneJoanne Murray, am acting as transcriptionist for Filbert SchilderAlexandria U. Kadolph, MD     I have reviewed the above documentation for accuracy and completeness, and I agree with the above. - Debbra RidingAlexandria Kadolph, MD

## 2017-06-13 LAB — COMPREHENSIVE METABOLIC PANEL
ALBUMIN: 4.2 g/dL (ref 3.6–4.8)
ALK PHOS: 62 IU/L (ref 39–117)
ALT: 12 IU/L (ref 0–32)
AST: 17 IU/L (ref 0–40)
Albumin/Globulin Ratio: 0.9 — ABNORMAL LOW (ref 1.2–2.2)
BUN / CREAT RATIO: 15 (ref 12–28)
BUN: 13 mg/dL (ref 8–27)
Bilirubin Total: 0.5 mg/dL (ref 0.0–1.2)
CO2: 25 mmol/L (ref 20–29)
Calcium: 9.3 mg/dL (ref 8.7–10.3)
Chloride: 97 mmol/L (ref 96–106)
Creatinine, Ser: 0.84 mg/dL (ref 0.57–1.00)
GFR calc Af Amer: 84 mL/min/{1.73_m2} (ref >59)
GFR calc non Af Amer: 73 mL/min/{1.73_m2} (ref >59)
GLOBULIN, TOTAL: 4.7 g/dL — AB (ref 1.5–4.5)
Glucose: 86 mg/dL (ref 65–99)
POTASSIUM: 4.2 mmol/L (ref 3.5–5.2)
SODIUM: 140 mmol/L (ref 134–144)
Total Protein: 8.9 g/dL — ABNORMAL HIGH (ref 6.0–8.5)

## 2017-06-13 LAB — CBC WITH DIFFERENTIAL/PLATELET
BASOS ABS: 0 10*3/uL (ref 0.0–0.2)
Basos: 0 %
EOS (ABSOLUTE): 0.2 10*3/uL (ref 0.0–0.4)
EOS: 4 %
HEMATOCRIT: 38.7 % (ref 34.0–46.6)
HEMOGLOBIN: 13 g/dL (ref 11.1–15.9)
Immature Grans (Abs): 0 10*3/uL (ref 0.0–0.1)
Immature Granulocytes: 1 %
LYMPHS ABS: 1.2 10*3/uL (ref 0.7–3.1)
Lymphs: 22 %
MCH: 25.6 pg — AB (ref 26.6–33.0)
MCHC: 33.6 g/dL (ref 31.5–35.7)
MCV: 76 fL — ABNORMAL LOW (ref 79–97)
MONOCYTES: 9 %
MONOS ABS: 0.5 10*3/uL (ref 0.1–0.9)
NEUTROS ABS: 3.7 10*3/uL (ref 1.4–7.0)
Neutrophils: 64 %
Platelets: 301 10*3/uL (ref 150–379)
RBC: 5.08 x10E6/uL (ref 3.77–5.28)
RDW: 16.2 % — ABNORMAL HIGH (ref 12.3–15.4)
WBC: 5.8 10*3/uL (ref 3.4–10.8)

## 2017-06-13 LAB — LIPID PANEL
CHOL/HDL RATIO: 4.2 ratio (ref 0.0–4.4)
Cholesterol, Total: 155 mg/dL (ref 100–199)
HDL: 37 mg/dL — ABNORMAL LOW (ref >39)
LDL CALC: 95 mg/dL (ref 0–99)
Triglycerides: 115 mg/dL (ref 0–149)
VLDL Cholesterol Cal: 23 mg/dL (ref 5–40)

## 2017-06-13 LAB — HEMOGLOBIN A1C
Est. average glucose Bld gHb Est-mCnc: 123 mg/dL
HEMOGLOBIN A1C: 5.9 % — AB (ref 4.8–5.6)

## 2017-06-13 LAB — T3: T3, Total: 139 ng/dL (ref 71–180)

## 2017-06-13 LAB — VITAMIN D 25 HYDROXY (VIT D DEFICIENCY, FRACTURES): Vit D, 25-Hydroxy: 56.3 ng/mL (ref 30.0–100.0)

## 2017-06-13 LAB — TSH: TSH: 1.51 u[IU]/mL (ref 0.450–4.500)

## 2017-06-13 LAB — INSULIN, RANDOM: INSULIN: 39 u[IU]/mL — ABNORMAL HIGH (ref 2.6–24.9)

## 2017-06-13 LAB — T4, FREE: FREE T4: 1.06 ng/dL (ref 0.82–1.77)

## 2017-06-13 LAB — VITAMIN B12: Vitamin B-12: 407 pg/mL (ref 232–1245)

## 2017-06-13 LAB — FOLATE

## 2017-06-15 ENCOUNTER — Encounter (INDEPENDENT_AMBULATORY_CARE_PROVIDER_SITE_OTHER): Payer: Self-pay | Admitting: Family Medicine

## 2017-06-15 DIAGNOSIS — G4733 Obstructive sleep apnea (adult) (pediatric): Secondary | ICD-10-CM | POA: Diagnosis not present

## 2017-06-20 DIAGNOSIS — M8589 Other specified disorders of bone density and structure, multiple sites: Secondary | ICD-10-CM | POA: Diagnosis not present

## 2017-06-22 DIAGNOSIS — G4733 Obstructive sleep apnea (adult) (pediatric): Secondary | ICD-10-CM | POA: Diagnosis not present

## 2017-06-26 ENCOUNTER — Ambulatory Visit (INDEPENDENT_AMBULATORY_CARE_PROVIDER_SITE_OTHER): Payer: PPO | Admitting: Family Medicine

## 2017-06-26 ENCOUNTER — Other Ambulatory Visit (INDEPENDENT_AMBULATORY_CARE_PROVIDER_SITE_OTHER): Payer: Self-pay | Admitting: Family Medicine

## 2017-06-26 VITALS — BP 133/59 | HR 58 | Temp 98.3°F | Ht 63.0 in | Wt 256.0 lb

## 2017-06-26 DIAGNOSIS — Z9189 Other specified personal risk factors, not elsewhere classified: Secondary | ICD-10-CM | POA: Diagnosis not present

## 2017-06-26 DIAGNOSIS — Z6841 Body Mass Index (BMI) 40.0 and over, adult: Secondary | ICD-10-CM

## 2017-06-26 DIAGNOSIS — R7303 Prediabetes: Secondary | ICD-10-CM

## 2017-06-26 DIAGNOSIS — E559 Vitamin D deficiency, unspecified: Secondary | ICD-10-CM | POA: Diagnosis not present

## 2017-06-26 MED ORDER — METFORMIN HCL 500 MG PO TABS: 500.0000 mg | ORAL_TABLET | Freq: Every day | ORAL | 0 refills | Status: DC

## 2017-06-26 NOTE — Progress Notes (Signed)
.   Office: [phone]  /  Fax: [phone]   HPI:   Chief Complaint: OBESITY Elizabeth [name] is here to discuss her progress with her obesity treatment plan. She is on the Category 3 plan and is following her eating plan approximately 95 % of the time. She states she is exercising with [name] personal; trainer for 90 minutes 2 times per week. Elizabeth [name] had [name] few days of extra snacks in week two. She is still struggling with constipation. Her last BM was today, but was small and hard. She denies hunger. She had extra snacks not secondary to hunger. She has late afternoon and early evening cravings. Elizabeth [name] is looking for more variety in terms of meat. Her weight is 256 lb (116.1 kg) today and has had [name] weight loss of 2 pounds over [name] period of 2 weeks since her last visit. She has lost 2 lbs since starting treatment with Elizabeth [name].  Pre-Diabetes Elizabeth [name] has [name] diagnosis of pre-diabetes based on her elevated Hgb A1c of 5.9 and fasting insulin of 39.0 and was informed this puts her at greater risk of developing diabetes. She is not taking metformin currently and continues to work on diet and exercise to decrease risk of diabetes. Elizabeth [name] has no prior diagnosis of pre-diabetes. She denies nausea or hypoglycemia.  At risk for diabetes Elizabeth [name] is at higher than average risk for developing diabetes due to her obesity and pre-diabetes. She currently denies polyuria or polydipsia.  Vitamin D deficiency Elizabeth [name] has [name] diagnosis of vitamin D deficiency. She has [name] vitamin D level of 56.3 and is currently taking OTC Vit D 2,000 IU daily. She denies nausea, vomiting or muscle weakness.  ALLERGIES: Allergies  Allergen Reactions  . Food Other (See Comments)    Strawberries: itching   . Imuran [Azathioprine] Other (See Comments)    Made me hurt all over     MEDICATIONS: Current Outpatient Medications on File Prior to Visit  Medication Sig Dispense Refill  . amLODipine (NORVASC) 5 MG tablet Take 5 mg by mouth daily.      Marland Kitchen. aspirin 81 MG tablet Take 81 mg by mouth daily.    . Cholecalciferol (VITAMIN D3) 2000 units TABS Take 2,000 Units by mouth daily.    . fluticasone (FLONASE) 50 MCG/ACT nasal spray Place 2 sprays into both nostrils daily.    . hydroxychloroquine (PLAQUENIL) 200 MG tablet Take 200 mg by mouth 2 (two) times daily. Take 1 tablet in the morning then half [name] tablet [name] night     . meloxicam (MOBIC) 7.5 MG tablet Take 7.5 mg by mouth daily as needed for pain.    . Multiple Vitamins-Minerals (MULTIVITAMIN WOMEN 50+) TABS Take 1 tablet by mouth daily.    Marland Kitchen. telmisartan-hydrochlorothiazide (MICARDIS HCT) 40-12.5 MG tablet Take 1 tablet by mouth daily.    . Turmeric 400 MG CAPS Take 1 capsule by mouth daily.     No current facility-administered medications on file prior to visit.     PAST MEDICAL HISTORY: Past Medical History:  Diagnosis Date  . Abnormal EKG   . Anemia    slight   . Arthritis   . 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 Back pain   . Depression   . Gallbladder problem   . Headache    left sided headache   . Hypertension   . Joint pain   . Obesity   . OSA (obstructive sleep apnea)    CPAP- has not  used 6 months to [name] year since used   . Osteoarthritis   . Shortness of breath dyspnea    with exertion   . Sjogren's syndrome (HCC) 03/22/2011   [place]  . Undifferentiated connective tissue disease (HCC)   . Vitamin D deficiency     PAST SURGICAL HISTORY: Past Surgical History:  Procedure Laterality Date  . ABDOMINAL HYSTERECTOMY    . ARTERY BIOPSY Left 06/09/2015   Procedure: LEFT TEMPORAL ARTERY BIOPSY;  Surgeon: Darnell Level, MD;  Location: [facility];  Service: General;  Laterality: Left;  . CHOLECYSTECTOMY    . paratoid tumor removed      SOCIAL HISTORY: Social History   Tobacco Use  . Smoking status: Never Smoker  . Smokeless tobacco: Never Used  Substance Use Topics  . Alcohol use: No  . Drug use: No    FAMILY HISTORY: Family History   Problem Relation Age of Onset  . Arthritis Mother   . Diabetes Mother   . Hypertension Mother   . Stroke Mother   . Obesity Mother   . Heart disease Father   . Hypertension Father   . Stroke Father   . Obesity Father   . Sleep apnea Father   . Hypertension Brother   . Asthma Paternal Grandmother     ROS: Review of Systems  Constitutional: Positive for weight loss.  Gastrointestinal: Negative for nausea and vomiting.  Genitourinary: Negative for frequency.  Musculoskeletal:       Negative for muscle weakness  Endo/Heme/Allergies: Negative for polydipsia.       Negative for hypoglycemia    PHYSICAL EXAM: Blood pressure (!) 133/59, pulse (!) 58, temperature 98.3 F (36.8 C), temperature source Oral, height 5\' 3"  (1.6 m), weight 256 lb (116.1 kg), SpO2 98 %. Body mass index is 45.35 kg/m. Physical Exam  Constitutional: She is oriented to person, place, and time. She appears well-developed and well-nourished.  Cardiovascular: Normal rate.  Pulmonary/Chest: Effort normal.  Musculoskeletal: Normal range of motion.  Neurological: She is oriented to person, place, and time.  Skin: Skin is warm and dry.  Psychiatric: She has [name] normal mood and affect. Her behavior is normal.  Vitals reviewed.   RECENT LABS AND TESTS: BMET    Component Value Date/Time   NA 140 06/12/2017 1224   NA 141 06/03/2015 1459   K 4.2 06/12/2017 1224   K 3.9 06/03/2015 1459   CL 97 06/12/2017 1224   CO2 25 06/12/2017 1224   CO2 25 06/03/2015 1459   GLUCOSE 86 06/12/2017 1224   GLUCOSE 149 (H) 06/03/2015 1459   BUN 13 06/12/2017 1224   BUN 21.6 06/03/2015 1459   CREATININE 0.84 06/12/2017 1224   CREATININE 0.9 06/03/2015 1459   CALCIUM 9.3 06/12/2017 1224   CALCIUM 8.3 (L) 06/03/2015 1459   GFRNONAA 73 06/12/2017 1224   GFRAA 84 06/12/2017 1224   Lab Results  Component Value Date   HGBA1C 5.9 (H) 06/12/2017   Lab Results  Component Value Date   INSULIN 39.0 (H) 06/12/2017   CBC     Component Value Date/Time   WBC 5.8 06/12/2017 1224   WBC 16.3 (H) 06/04/2015 1335   RBC 5.08 06/12/2017 1224   RBC 4.33 06/04/2015 1335   HGB 13.0 06/12/2017 1224   HGB 11.0 (L) 06/03/2015 1459   HCT 38.7 06/12/2017 1224   HCT 34.3 (L) 06/03/2015 1459   PLT 301 06/12/2017 1224   MCV 76 (L) 06/12/2017 1224   MCV 79.2 (L) 06/03/2015  1459   MCH 25.6 (L) 06/12/2017 1224   MCH 24.9 (L) 06/04/2015 1335   MCHC 33.6 06/12/2017 1224   MCHC 30.5 06/04/2015 1335   RDW 16.2 (H) 06/12/2017 1224   RDW 18.6 (H) 06/03/2015 1459   LYMPHSABS 1.2 06/12/2017 1224   LYMPHSABS 1.8 06/03/2015 1459   MONOABS 0.9 06/03/2015 1459   EOSABS 0.2 06/12/2017 1224   BASOSABS 0.0 06/12/2017 1224   BASOSABS 0.0 06/03/2015 1459   Iron/TIBC/Ferritin/ %Sat    Component Value Date/Time   IRON 63 06/03/2015 1459   TIBC 230 (L) 06/03/2015 1459   FERRITIN 674 (H) 06/03/2015 1459   IRONPCTSAT 27 06/03/2015 1459   Lipid Panel     Component Value Date/Time   CHOL 155 06/12/2017 1224   TRIG 115 06/12/2017 1224   HDL 37 (L) 06/12/2017 1224   CHOLHDL 4.2 06/12/2017 1224   LDLCALC 95 06/12/2017 1224   Hepatic Function Panel     Component Value Date/Time   PROT 8.9 (H) 06/12/2017 1224   PROT 7.5 06/03/2015 1459   ALBUMIN 4.2 06/12/2017 1224   ALBUMIN 3.0 (L) 06/03/2015 1459   AST 17 06/12/2017 1224   AST 15 06/03/2015 1459   ALT 12 06/12/2017 1224   ALT 33 06/03/2015 1459   ALKPHOS 62 06/12/2017 1224   ALKPHOS 53 06/03/2015 1459   BILITOT 0.5 06/12/2017 1224   BILITOT <0.30 06/03/2015 1459      Component Value Date/Time   TSH 1.510 06/12/2017 1224   Results for Regala, Elizabeth [name] (MRN [id number]) as of 06/26/2017 17:42  Ref. Range 06/12/2017 12:24  Vitamin D, 25-Hydroxy Latest Ref Range: 30.0 - 100.0 ng/mL 56.3   ASSESSMENT AND PLAN: Pre-diabetes - Plan: metFORMIN (GLUCOPHAGE) 500 MG tablet  Vitamin D deficiency  At risk for diabetes mellitus  Class 3 severe obesity with serious comorbidity and  body mass index (BMI) of 45.0 to 49.9 in adult, unspecified obesity type (HCC)  PLAN:  Pre-Diabetes Elizabeth [name] will continue to work on weight loss, exercise, and decreasing simple carbohydrates in her diet to help decrease the risk of diabetes. We dicussed metformin including benefits and risks. She was informed that eating too many simple carbohydrates or too many calories at one sitting increases the likelihood of GI side effects. Elizabeth [name] agreed to start metformin 500 mg qAM #30 with no refills and follow up with [place] as directed to monitor her progress.  Diabetes risk counseling Elizabeth [name] was given extended (30 minutes) diabetes prevention counseling today. She is 67 y.o. female and has risk factors for diabetes including obesity and pre-diabetes. We discussed intensive lifestyle modifications today with an emphasis on weight loss as well as increasing exercise and decreasing simple carbohydrates in her diet.  Vitamin D Deficiency Elizabeth [name] was informed that low vitamin D levels contributes to fatigue and are associated with obesity, breast, and colon cancer. She agrees to continue to take OTC Vit D @2 ,000 IU every day and will follow up for routine testing of vitamin D, at least 2-3 times per year. She was informed of the risk of over-replacement of vitamin D and agrees to not increase her dose unless she discusses this with [place] first.  Obesity Elizabeth [name] is currently in the action stage of change. As such, her goal is to continue with weight loss efforts She has agreed to follow the Category 3 plan Elizabeth [name] has been instructed to work up to [name] goal of 150 minutes of combined cardio and strengthening exercise per week for weight loss and overall  health benefits. We discussed the following Behavioral Modification Strategies today: better snacking choices, increase H2O intake, planning for success, increasing lean protein intake, increasing vegetables, increasing fiber rich foods and work on meal planning and  easy cooking plans  Chazlyn has agreed to follow up with our clinic in 2 weeks. She was informed of the importance of frequent follow up visits to maximize her success with intensive lifestyle modifications for her multiple health conditions.   OBESITY BEHAVIORAL INTERVENTION VISIT  Today's visit was # 2 out of 22.  Starting weight: 258 lbs Starting date: 06/12/17 Today's weight : 256 lbs Today's date: 06/26/2017 Total lbs lost to date: 2 (Patients must lose 7 lbs in the first 6 months to continue with counseling)   ASK: We discussed the diagnosis of obesity with [name] today and Akirah agreed to give [place] permission to discuss obesity behavioral modification therapy today.  ASSESS: Emmilia has the diagnosis of obesity and her BMI today is 45.36 Joelynn is in the action stage of change   ADVISE: Teanna was educated on the multiple health risks of obesity as well as the benefit of weight loss to improve her health. She was advised of the need for long term treatment and the importance of lifestyle modifications.  AGREE: Multiple dietary modification options and treatment options were discussed and  Aleni agreed to the above obesity treatment plan.  I, Nevada Crane, am acting as transcriptionist for Filbert Schilder, MD  I have reviewed the above documentation for accuracy and completeness, and I agree with the above. - Debbra Riding, MD

## 2017-06-29 DIAGNOSIS — G4733 Obstructive sleep apnea (adult) (pediatric): Secondary | ICD-10-CM | POA: Diagnosis not present

## 2017-07-11 ENCOUNTER — Ambulatory Visit (INDEPENDENT_AMBULATORY_CARE_PROVIDER_SITE_OTHER): Payer: PPO | Admitting: Family Medicine

## 2017-07-11 VITALS — BP 114/72 | HR 52 | Temp 98.3°F | Ht 63.0 in | Wt 251.0 lb

## 2017-07-11 DIAGNOSIS — R7303 Prediabetes: Secondary | ICD-10-CM | POA: Diagnosis not present

## 2017-07-11 DIAGNOSIS — I10 Essential (primary) hypertension: Secondary | ICD-10-CM | POA: Diagnosis not present

## 2017-07-11 DIAGNOSIS — Z6841 Body Mass Index (BMI) 40.0 and over, adult: Secondary | ICD-10-CM | POA: Diagnosis not present

## 2017-07-12 NOTE — Progress Notes (Signed)
Office: [phone]  /  Fax: [phone]   HPI:   Chief Complaint: OBESITY Elizabeth [name] is here to discuss her progress with her obesity treatment plan. She is on the Category 3 plan and is following her eating plan approximately 97 % of the time. She states she is exercising with [name] personal trainer and doing cardio for 60 minutes 2 times per week. Elizabeth [name] is getting better adjusted to the meal plan. Elizabeth [name] is eating more fish and she is getting tired of chicken. Her weight is 251 lb (113.9 kg) today and has had [name] weight loss of 5 pounds over [name] period of 2 weeks since her last visit. She has lost 7 lbs since starting treatment with us.  Pre-Diabetes Elizabeth [name] has [name] diagnosis of pre-diabetes based on her elevated Hgb A1c and was informed this puts her at greater risk of developing diabetes. She is on metformin currently. She had some diarrhea initially with metformin, but this is better now. Elizabeth [name] continues to work on diet and exercise to decrease risk of diabetes. She denies nausea or hypoglycemia.  Hypertension Elizabeth [name] is [name] 67 y.o. female with hypertension.  Elizabeth [name] denies chest pain, chest pressure or headache. She is working weight loss to help control her blood pressure with the goal of decreasing her risk of heart attack and stroke. [name] blood pressure is significantly better controlled today.  ALLERGIES: Allergies  Allergen Reactions  . Food Other (See Comments)    Strawberries: itching   . Imuran [Azathioprine] Other (See Comments)    Made me hurt all over     MEDICATIONS: Current Outpatient Medications on File Prior to Visit  Medication Sig Dispense Refill  . amLODipine (NORVASC) 5 MG tablet Take 5 mg by mouth daily.    Marland Kitchen. aspirin 81 MG tablet Take 81 mg by mouth daily.    . Cholecalciferol (VITAMIN D3) 2000 units TABS Take 2,000 Units by mouth daily.    . fluticasone (FLONASE) 50 MCG/ACT nasal spray Place 2 sprays into both nostrils daily.    .  hydroxychloroquine (PLAQUENIL) 200 MG tablet Take 200 mg by mouth 2 (two) times daily. Take 1 tablet in the morning then half [name] tablet [name] night     . meloxicam (MOBIC) 7.5 MG tablet Take 7.5 mg by mouth daily as needed for pain.    . metFORMIN (GLUCOPHAGE) 500 MG tablet Take 1 tablet (500 mg total) by mouth daily with breakfast. 30 tablet 0  . Multiple Vitamins-Minerals (MULTIVITAMIN WOMEN 50+) TABS Take 1 tablet by mouth daily.    Marland Kitchen. telmisartan-hydrochlorothiazide (MICARDIS HCT) 40-12.5 MG tablet Take 1 tablet by mouth daily.    . Turmeric 400 MG CAPS Take 1 capsule by mouth daily.     No current facility-administered medications on file prior to visit.     PAST MEDICAL HISTORY: Past Medical History:  Diagnosis Date  . Abnormal EKG   . Anemia    slight   . Arthritis   . 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 Back pain   . Depression   . Gallbladder problem   . Headache    left sided headache   . Hypertension   . Joint pain   . Obesity   . OSA (obstructive sleep apnea)    CPAP- has not used 6 months to [name] year since used   . Osteoarthritis   . Shortness of breath dyspnea    with exertion   .  Sjogren's syndrome (HCC) 03/22/2011   Elizabeth [name]  . Undifferentiated connective tissue disease (HCC)   . Vitamin D deficiency     PAST SURGICAL HISTORY: Past Surgical History:  Procedure Laterality Date  . ABDOMINAL HYSTERECTOMY    . ARTERY BIOPSY Left 06/09/2015   Procedure: LEFT TEMPORAL ARTERY BIOPSY;  Surgeon: Darnell Level, MD;  Location: [facility];  Service: General;  Laterality: Left;  . CHOLECYSTECTOMY    . paratoid tumor removed      SOCIAL HISTORY: Social History   Tobacco Use  . Smoking status: Never Smoker  . Smokeless tobacco: Never Used  Substance Use Topics  . Alcohol use: No  . Drug use: No    FAMILY HISTORY: Family History  Problem Relation Age of Onset  . Arthritis Mother   . Diabetes Mother   . Hypertension Mother   . Stroke Mother   .  Obesity Mother   . Heart disease Father   . Hypertension Father   . Stroke Father   . Obesity Father   . Sleep apnea Father   . Hypertension Brother   . Asthma Paternal Grandmother     ROS: Review of Systems  Constitutional: Positive for weight loss.  Cardiovascular: Negative for chest pain.       Negative for chest pressure  Gastrointestinal: Positive for diarrhea. Negative for nausea.  Neurological: Negative for headaches.  Endo/Heme/Allergies:       Negative for hypoglycemia    PHYSICAL EXAM: Blood pressure 114/72, pulse (!) 52, temperature 98.3 F (36.8 C), temperature source Oral, height 5\' 3"  (1.6 m), weight 251 lb (113.9 kg), SpO2 99 %. Body mass index is 44.46 kg/m. Physical Exam  Constitutional: She is oriented to person, place, and time. She appears well-developed and well-nourished.  Cardiovascular: Normal rate.  Pulmonary/Chest: Effort normal.  Musculoskeletal: Normal range of motion.  Neurological: She is oriented to person, place, and time.  Skin: Skin is warm and dry.  Psychiatric: She has [name] normal mood and affect. Her behavior is normal.  Vitals reviewed.   RECENT LABS AND TESTS: BMET    Component Value Date/Time   NA 140 06/12/2017 1224   NA 141 06/03/2015 1459   K 4.2 06/12/2017 1224   K 3.9 06/03/2015 1459   CL 97 06/12/2017 1224   CO2 25 06/12/2017 1224   CO2 25 06/03/2015 1459   GLUCOSE 86 06/12/2017 1224   GLUCOSE 149 (H) 06/03/2015 1459   BUN 13 06/12/2017 1224   BUN 21.6 06/03/2015 1459   CREATININE 0.84 06/12/2017 1224   CREATININE 0.9 06/03/2015 1459   CALCIUM 9.3 06/12/2017 1224   CALCIUM 8.3 (L) 06/03/2015 1459   GFRNONAA 73 06/12/2017 1224   GFRAA 84 06/12/2017 1224   Lab Results  Component Value Date   HGBA1C 5.9 (H) 06/12/2017   Lab Results  Component Value Date   INSULIN 39.0 (H) 06/12/2017   CBC    Component Value Date/Time   WBC 5.8 06/12/2017 1224   WBC 16.3 (H) 06/04/2015 1335   RBC 5.08 06/12/2017 1224   RBC  4.33 06/04/2015 1335   HGB 13.0 06/12/2017 1224   HGB 11.0 (L) 06/03/2015 1459   HCT 38.7 06/12/2017 1224   HCT 34.3 (L) 06/03/2015 1459   PLT 301 06/12/2017 1224   MCV 76 (L) 06/12/2017 1224   MCV 79.2 (L) 06/03/2015 1459   MCH 25.6 (L) 06/12/2017 1224   MCH 24.9 (L) 06/04/2015 1335   MCHC 33.6 06/12/2017 1224   MCHC 30.5 06/04/2015  1335   RDW 16.2 (H) 06/12/2017 1224   RDW 18.6 (H) 06/03/2015 1459   LYMPHSABS 1.2 06/12/2017 1224   LYMPHSABS 1.8 06/03/2015 1459   MONOABS 0.9 06/03/2015 1459   EOSABS 0.2 06/12/2017 1224   BASOSABS 0.0 06/12/2017 1224   BASOSABS 0.0 06/03/2015 1459   Iron/TIBC/Ferritin/ %Sat    Component Value Date/Time   IRON 63 06/03/2015 1459   TIBC 230 (L) 06/03/2015 1459   FERRITIN 674 (H) 06/03/2015 1459   IRONPCTSAT 27 06/03/2015 1459   Lipid Panel     Component Value Date/Time   CHOL 155 06/12/2017 1224   TRIG 115 06/12/2017 1224   HDL 37 (L) 06/12/2017 1224   CHOLHDL 4.2 06/12/2017 1224   LDLCALC 95 06/12/2017 1224   Hepatic Function Panel     Component Value Date/Time   PROT 8.9 (H) 06/12/2017 1224   PROT 7.5 06/03/2015 1459   ALBUMIN 4.2 06/12/2017 1224   ALBUMIN 3.0 (L) 06/03/2015 1459   AST 17 06/12/2017 1224   AST 15 06/03/2015 1459   ALT 12 06/12/2017 1224   ALT 33 06/03/2015 1459   ALKPHOS 62 06/12/2017 1224   ALKPHOS 53 06/03/2015 1459   BILITOT 0.5 06/12/2017 1224   BILITOT <0.30 06/03/2015 1459      Component Value Date/Time   TSH 1.510 06/12/2017 1224   Results for Elizabeth [name], Elizabeth [name] (MRN [id number]) as of 07/12/2017 13:11  Ref. Range 06/12/2017 12:24  Vitamin D, 25-Hydroxy Latest Ref Range: 30.0 - 100.0 ng/mL 56.3   ASSESSMENT AND PLAN: Prediabetes  Essential hypertension  Class 3 severe obesity with serious comorbidity and body mass index (BMI) of 40.0 to 44.9 in adult, unspecified obesity type (HCC)  PLAN:  Pre-Diabetes Keren will continue to work on weight loss, exercise, and decreasing simple carbohydrates  in her diet to help decrease the risk of diabetes. We dicussed metformin including benefits and risks. She was informed that eating too many simple carbohydrates or too many calories at one sitting increases the likelihood of GI side effects. Analiya  Agrees to continue metformin 500 mg qAM (no refill needed). Kassondra agreed to follow up with [place] as directed to monitor her progress.  Hypertension We discussed sodium restriction, working on healthy weight loss, and [name] regular exercise program as the means to achieve improved blood pressure control. Annalyse agreed with this plan and agreed to follow up as directed. We will continue to monitor her blood pressure as well as her progress with the above lifestyle modifications. She will continue her medications as prescribed and will watch for signs of hypotension as she continues her lifestyle modifications. If her blood pressure stays controlled and at the current numbers, we will decrease amlodipine at the next visit.  We spent > than 50% of the 15 minute visit on the counseling as documented in the note.  Obesity Kamy is currently in the action stage of change. As such, her goal is to continue with weight loss efforts She has agreed to follow the Pescatarian eating plan Chrystie has been instructed to work up to [name] goal of 150 minutes of combined cardio and strengthening exercise per week for weight loss and overall health benefits. We discussed the following Behavioral Modification Strategies today: better snacking choices, planning for success, increasing lean protein intake, increasing vegetables and work on meal planning and easy cooking plans  Latiesha has agreed to follow up with our clinic in 2 weeks. She was informed of the importance of frequent follow up visits to  maximize her success with intensive lifestyle modifications for her multiple health conditions.   OBESITY BEHAVIORAL INTERVENTION VISIT  Today's visit was # 3 out of  22.  Starting weight: 258 lbs Starting date: 06/12/17 Today's weight : 251 lbs  Today's date: 07/11/2017 Total lbs lost to date: 7 (Patients must lose 7 lbs in the first 6 months to continue with counseling)   ASK: We discussed the diagnosis of obesity with Elizabeth [name] today and Shaila agreed to give [place] permission to discuss obesity behavioral modification therapy today.  ASSESS: Kaleah has the diagnosis of obesity and her BMI today is 44.47 Diahn is in the action stage of change   ADVISE: Jewell was educated on the multiple health risks of obesity as well as the benefit of weight loss to improve her health. She was advised of the need for long term treatment and the importance of lifestyle modifications.  AGREE: Multiple dietary modification options and treatment options were discussed and  Jerald agreed to the above obesity treatment plan.  I, [place] Crane, am acting as transcriptionist for Filbert Schilder, MD  I have reviewed the above documentation for accuracy and completeness, and I agree with the above. - Debbra Riding, MD

## 2017-07-25 DIAGNOSIS — H40013 Open angle with borderline findings, low risk, bilateral: Secondary | ICD-10-CM | POA: Diagnosis not present

## 2017-07-25 DIAGNOSIS — H40053 Ocular hypertension, bilateral: Secondary | ICD-10-CM | POA: Diagnosis not present

## 2017-07-26 ENCOUNTER — Ambulatory Visit (INDEPENDENT_AMBULATORY_CARE_PROVIDER_SITE_OTHER): Payer: PPO | Admitting: Family Medicine

## 2017-07-26 ENCOUNTER — Other Ambulatory Visit (INDEPENDENT_AMBULATORY_CARE_PROVIDER_SITE_OTHER): Payer: Self-pay | Admitting: Family Medicine

## 2017-07-26 VITALS — BP 130/60 | HR 57 | Temp 97.8°F | Ht 63.0 in | Wt 246.0 lb

## 2017-07-26 DIAGNOSIS — I10 Essential (primary) hypertension: Secondary | ICD-10-CM | POA: Diagnosis not present

## 2017-07-26 DIAGNOSIS — R7303 Prediabetes: Secondary | ICD-10-CM

## 2017-07-26 DIAGNOSIS — Z6841 Body Mass Index (BMI) 40.0 and over, adult: Secondary | ICD-10-CM | POA: Diagnosis not present

## 2017-07-26 DIAGNOSIS — Z9189 Other specified personal risk factors, not elsewhere classified: Secondary | ICD-10-CM | POA: Diagnosis not present

## 2017-07-26 MED ORDER — METFORMIN HCL 500 MG PO TABS: 500.0000 mg | ORAL_TABLET | Freq: Every day | ORAL | 0 refills | Status: DC

## 2017-07-26 NOTE — Progress Notes (Signed)
Office: [phone]  /  Fax: [phone]   HPI:   Chief Complaint: OBESITY Elizabeth [name] is here to discuss her progress with her obesity treatment plan. She is on the Pescatarian eating plan and is following her eating plan approximately 80 % of the time. She states she is doing aerobics and training for 30-60 minutes 2-3 times per week. Elizabeth [name] really is enjoying meal plan, likes more vegetables.  Her weight is 246 lb (111.6 kg) today and has had Elizabeth weight loss of 5 pounds over Elizabeth period of 2 weeks since her last visit. She has lost 12 lbs since starting treatment with [place].  Pre-Diabetes Elizabeth [name] has Elizabeth diagnosis of pre-diabetes based on her elevated Hgb A1c and was informed this puts her at greater risk of developing diabetes. She is taking metformin currently and continues to work on diet and exercise to decrease risk of diabetes. She notes carbohydrate cravings and she denies nausea or hypoglycemia.  At risk for diabetes Elizabeth [name] is at higher than average risk for developing diabetes due to her obesity and pre-diabetes. She currently denies polyuria or polydipsia.  Hypertension Elizabeth [name] is Elizabeth 67 y.o. female with hypertension. Elizabeth [name]'s blood pressure is controlled. She denies chest pain, chest pressure, or headache. She is occasionally feeling dizziness, blood pressure at home 110/60's. She is working weight loss to help control her blood pressure with the goal of decreasing her risk of heart attack and stroke.  ALLERGIES: Allergies  Allergen Reactions  . Food Other (See Comments)    Strawberries: itching   . Imuran [Azathioprine] Other (See Comments)    Made me hurt all over     MEDICATIONS: Current Outpatient Medications on File Prior to Visit  Medication Sig Dispense Refill  . amLODipine (NORVASC) 5 MG tablet Take 5 mg by mouth daily.    Marland Kitchen aspirin 81 MG tablet Take 81 mg by mouth daily.    . Cholecalciferol (VITAMIN D3) 2000 units TABS Take 2,000 Units by mouth daily.    .  fluticasone (FLONASE) 50 MCG/ACT nasal spray Place 2 sprays into both nostrils daily.    . hydroxychloroquine (PLAQUENIL) 200 MG tablet Take 200 mg by mouth 2 (two) times daily. Take 1 tablet in the morning then half Elizabeth tablet Elizabeth night     . meloxicam (MOBIC) 7.5 MG tablet Take 7.5 mg by mouth daily as needed for pain.    . Multiple Vitamins-Minerals (MULTIVITAMIN WOMEN 50+) TABS Take 1 tablet by mouth daily.    Marland Kitchen telmisartan-hydrochlorothiazide (MICARDIS HCT) 40-12.5 MG tablet Take 1 tablet by mouth daily.    . Turmeric 400 MG CAPS Take 1 capsule by mouth daily.     No current facility-administered medications on file prior to visit.     PAST MEDICAL HISTORY: Past Medical History:  Diagnosis Date  . Abnormal EKG   . Anemia    slight   . Arthritis   . 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 Back pain   . Depression   . Gallbladder problem   . Headache    left sided headache   . Hypertension   . Joint pain   . Obesity   . OSA (obstructive sleep apnea)    CPAP- has not used 6 months to Elizabeth year since used   . Osteoarthritis   . Shortness of breath dyspnea    with exertion   . Sjogren's syndrome (HCC) 03/22/2011   Dev[place]war  . Undifferentiated connective tissue  disease (HCC)   . Vitamin D deficiency     PAST SURGICAL HISTORY: Past Surgical History:  Procedure Laterality Date  . ABDOMINAL HYSTERECTOMY    . ARTERY BIOPSY Left 06/09/2015   Procedure: LEFT TEMPORAL ARTERY BIOPSY;  Surgeon: Darnell Level, MD;  Location: [facility];  Service: General;  Laterality: Left;  . CHOLECYSTECTOMY    . paratoid tumor removed      SOCIAL HISTORY: Social History   Tobacco Use  . Smoking status: Never Smoker  . Smokeless tobacco: Never Used  Substance Use Topics  . Alcohol use: No  . Drug use: No    FAMILY HISTORY: Family History  Problem Relation Age of Onset  . Arthritis Mother   . Diabetes Mother   . Hypertension Mother   . Stroke Mother   . Obesity Mother   .  Heart disease Father   . Hypertension Father   . Stroke Father   . Obesity Father   . Sleep apnea Father   . Hypertension Brother   . Asthma Paternal Grandmother     ROS: Review of Systems  Constitutional: Positive for weight loss.  Cardiovascular: Negative for chest pain.       Negative chest pressure  Gastrointestinal: Negative for nausea.  Genitourinary: Negative for frequency.  Neurological: Positive for dizziness. Negative for headaches.  Endo/Heme/Allergies: Negative for polydipsia.       Negative hypoglycemia    PHYSICAL EXAM: Blood pressure 130/60, pulse (!) 57, temperature 97.8 F (36.6 C), temperature source Oral, height  (1.6 m), weight 246 lb (111.6 kg), SpO2 99 %. Body mass index is 43.58 kg/m. Physical Exam  Constitutional: She is oriented to person, place, and time. She appears well-developed and well-nourished.  Cardiovascular: Normal rate.  Pulmonary/Chest: Effort normal.  Musculoskeletal: Normal range of motion.  Neurological: She is oriented to person, place, and time.  Skin: Skin is warm and dry.  Psychiatric: She has Elizabeth normal mood and affect. Her behavior is normal.  Vitals reviewed.   RECENT LABS AND TESTS: BMET    Component Value Date/Time   NA 140 06/12/2017 1224   NA 141 06/03/2015 1459   K 4.2 06/12/2017 1224   K 3.9 06/03/2015 1459   CL 97 06/12/2017 1224   CO2 25 06/12/2017 1224   CO2 25 06/03/2015 1459   GLUCOSE 86 06/12/2017 1224   GLUCOSE 149 (H) 06/03/2015 1459   BUN 13 06/12/2017 1224   BUN 21.6 06/03/2015 1459   CREATININE 0.84 06/12/2017 1224   CREATININE 0.9 06/03/2015 1459   CALCIUM 9.3 06/12/2017 1224   CALCIUM 8.3 (L) 06/03/2015 1459   GFRNONAA 73 06/12/2017 1224   GFRAA 84 06/12/2017 1224   Lab Results  Component Value Date   HGBA1C 5.9 (H) 06/12/2017   Lab Results  Component Value Date   INSULIN 39.0 (H) 06/12/2017   CBC    Component Value Date/Time   WBC 5.8 06/12/2017 1224   WBC 16.3 (H) 06/04/2015  1335   RBC 5.08 06/12/2017 1224   RBC 4.33 06/04/2015 1335   HGB 13.0 06/12/2017 1224   HGB 11.0 (L) 06/03/2015 1459   HCT 38.7 06/12/2017 1224   HCT 34.3 (L) 06/03/2015 1459   PLT 301 06/12/2017 1224   MCV 76 (L) 06/12/2017 1224   MCV 79.2 (L) 06/03/2015 1459   MCH 25.6 (L) 06/12/2017 1224   MCH 24.9 (L) 06/04/2015 1335   MCHC 33.6 06/12/2017 1224   MCHC 30.5 06/04/2015 1335   RDW 16.2 (H)  06/12/2017 1224   RDW 18.6 (H) 06/03/2015 1459   LYMPHSABS 1.2 06/12/2017 1224   LYMPHSABS 1.8 06/03/2015 1459   MONOABS 0.9 06/03/2015 1459   EOSABS 0.2 06/12/2017 1224   BASOSABS 0.0 06/12/2017 1224   BASOSABS 0.0 06/03/2015 1459   Iron/TIBC/Ferritin/ %Sat    Component Value Date/Time   IRON 63 06/03/2015 1459   TIBC 230 (L) 06/03/2015 1459   FERRITIN 674 (H) 06/03/2015 1459   IRONPCTSAT 27 06/03/2015 1459   Lipid Panel     Component Value Date/Time   CHOL 155 06/12/2017 1224   TRIG 115 06/12/2017 1224   HDL 37 (L) 06/12/2017 1224   CHOLHDL 4.2 06/12/2017 1224   LDLCALC 95 06/12/2017 1224   Hepatic Function Panel     Component Value Date/Time   PROT 8.9 (H) 06/12/2017 1224   PROT 7.5 06/03/2015 1459   ALBUMIN 4.2 06/12/2017 1224   ALBUMIN 3.0 (L) 06/03/2015 1459   AST 17 06/12/2017 1224   AST 15 06/03/2015 1459   ALT 12 06/12/2017 1224   ALT 33 06/03/2015 1459   ALKPHOS 62 06/12/2017 1224   ALKPHOS 53 06/03/2015 1459   BILITOT 0.5 06/12/2017 1224   BILITOT <0.30 06/03/2015 1459      Component Value Date/Time   TSH 1.510 06/12/2017 1224    ASSESSMENT AND PLAN: Prediabetes - Plan: metFORMIN (GLUCOPHAGE) 500 MG tablet  Essential hypertension  At risk for diabetes mellitus  Class 3 severe obesity with serious comorbidity and body mass index (BMI) of 40.0 to 44.9 in adult, unspecified obesity type (HCC)  PLAN:  Pre-Diabetes Elizabeth [name] will continue to work on weight loss, exercise, and decreasing simple carbohydrates in her diet to help decrease the risk of  diabetes. We dicussed metformin including benefits and risks. She was informed that eating too many simple carbohydrates or too many calories at one sitting increases the likelihood of GI side effects. Elizabeth [name] agrees to continue taking metformin 500 mg PO q AM #30 and we will refill for 1 month. Elizabeth [name] agrees to follow up with our clinic in 2 weeks as directed to monitor her progress.  Diabetes risk counselling Elizabeth [name] was given extended (15 minutes) diabetes prevention counseling today. She is 67 y.o. female and has risk factors for diabetes including obesity and pre-diabetes. We discussed intensive lifestyle modifications today with an emphasis on weight loss as well as increasing exercise and decreasing simple carbohydrates in her diet.  Hypertension We discussed sodium restriction, working on healthy weight loss, and Elizabeth regular exercise program as the means to achieve improved blood pressure control. Elizabeth [name] agreed with this plan and agreed to follow up as directed. We will continue to monitor her blood pressure as well as her progress with the above lifestyle modifications. She will continue her current medications, she is to message in 4 days with blood pressure reading and will decrease amlodipine to 2.5 mg if blood pressure decreases. She will watch for signs of hypotension as she continues her lifestyle modifications. Elizabeth [name] agrees to follow up with our clinic in 2 weeks.  Obesity Elizabeth [name] is currently in the action stage of change. As such, her goal is to continue with weight loss efforts She has agreed to follow the Pescatarian eating plan + 300 calories Elizabeth [name] has been instructed to work up to Elizabeth goal of 150 minutes of combined cardio and strengthening exercise per week for weight loss and overall health benefits. We discussed the following Behavioral Modification Strategies today: increasing lean protein intake, increasing vegetables, work  on meal planning and easy cooking plans, better  snacking choices, and planning for success   Adabelle has agreed to follow up with our clinic in 2 weeks. She was informed of the importance of frequent follow up visits to maximize her success with intensive lifestyle modifications for her multiple health conditions.   OBESITY BEHAVIORAL INTERVENTION VISIT  Today's visit was # 4 out of 22.  Starting weight: 258 lbs Starting date: 06/12/17 Today's weight : 246 lbs  Today's date: 07/26/2017 Total lbs lost to date: 12 (Patients must lose 7 lbs in the first 6 months to continue with counseling)   ASK: We discussed the diagnosis of obesity with Elizabeth [name] today and Elizabeth [name] agreed to give [place] permission to discuss obesity behavioral modification therapy today.  ASSESS: Elizabeth [name] has the diagnosis of obesity and her BMI today is 43.59 Elizabeth [name] is in the action stage of change   ADVISE: Elizabeth [name] was educated on the multiple health risks of obesity as well as the benefit of weight loss to improve her health. She was advised of the need for long term treatment and the importance of lifestyle modifications.  AGREE: Multiple dietary modification options and treatment options were discussed and  Elizabeth [name] agreed to the above obesity treatment plan.  I, Burt Knack, am acting as transcriptionist for Debbra Riding, MD  I have reviewed the above documentation for accuracy and completeness, and I agree with the above. - Debbra Riding, MD

## 2017-07-29 DIAGNOSIS — G4733 Obstructive sleep apnea (adult) (pediatric): Secondary | ICD-10-CM | POA: Diagnosis not present

## 2017-07-31 ENCOUNTER — Encounter (INDEPENDENT_AMBULATORY_CARE_PROVIDER_SITE_OTHER): Payer: Self-pay | Admitting: Family Medicine

## 2017-08-02 DIAGNOSIS — G4733 Obstructive sleep apnea (adult) (pediatric): Secondary | ICD-10-CM | POA: Diagnosis not present

## 2017-08-03 DIAGNOSIS — M159 Polyosteoarthritis, unspecified: Secondary | ICD-10-CM | POA: Diagnosis not present

## 2017-08-03 DIAGNOSIS — Z79899 Other long term (current) drug therapy: Secondary | ICD-10-CM | POA: Diagnosis not present

## 2017-08-03 DIAGNOSIS — M359 Systemic involvement of connective tissue, unspecified: Secondary | ICD-10-CM | POA: Diagnosis not present

## 2017-08-03 DIAGNOSIS — R21 Rash and other nonspecific skin eruption: Secondary | ICD-10-CM | POA: Diagnosis not present

## 2017-08-08 DIAGNOSIS — I10 Essential (primary) hypertension: Secondary | ICD-10-CM | POA: Diagnosis not present

## 2017-08-08 DIAGNOSIS — M799 Soft tissue disorder, unspecified: Secondary | ICD-10-CM | POA: Diagnosis not present

## 2017-08-08 DIAGNOSIS — R21 Rash and other nonspecific skin eruption: Secondary | ICD-10-CM | POA: Diagnosis not present

## 2017-08-10 ENCOUNTER — Ambulatory Visit (INDEPENDENT_AMBULATORY_CARE_PROVIDER_SITE_OTHER): Payer: PPO | Admitting: Family Medicine

## 2017-08-10 ENCOUNTER — Other Ambulatory Visit: Payer: Self-pay | Admitting: Family Medicine

## 2017-08-10 VITALS — BP 122/75 | HR 59 | Temp 98.5°F | Ht 63.0 in | Wt 242.0 lb

## 2017-08-10 DIAGNOSIS — I10 Essential (primary) hypertension: Secondary | ICD-10-CM

## 2017-08-10 DIAGNOSIS — R7303 Prediabetes: Secondary | ICD-10-CM | POA: Diagnosis not present

## 2017-08-10 DIAGNOSIS — Z6841 Body Mass Index (BMI) 40.0 and over, adult: Secondary | ICD-10-CM | POA: Diagnosis not present

## 2017-08-10 DIAGNOSIS — M7989 Other specified soft tissue disorders: Secondary | ICD-10-CM

## 2017-08-10 DIAGNOSIS — Z9189 Other specified personal risk factors, not elsewhere classified: Secondary | ICD-10-CM | POA: Diagnosis not present

## 2017-08-10 MED ORDER — METFORMIN HCL 500 MG PO TABS: 500.0000 mg | ORAL_TABLET | Freq: Every day | ORAL | 0 refills | Status: DC

## 2017-08-10 NOTE — Progress Notes (Signed)
Office: [phone]  /  Fax: [phone]   HPI:   Chief Complaint: OBESITY Elizabeth [name] is here to discuss her progress with her obesity treatment plan. She is on the Pescatarian eating plan +300 calories and is following her eating plan approximately 95 % of the time. She states she is exercising with a trainer for 60 to 90 minutes 2 times per week. Tiney still has times of over indulging in snacks. She is enjoying the pescatarian meal plan. Her weight is 242 lb (109.8 kg) today and has had a weight loss of 4 pounds over a period of 2 weeks since her last visit. She has lost 16 lbs since starting treatment with [place].  Hypertension Elizabeth [name] is a 67 y.o. female with hypertension. She has less dizziness since decreasing amlodipine. [name] denies chest pain, chest pressure or headache. She is working weight loss to help control her blood pressure with the goal of decreasing her risk of heart attack and stroke. [name] blood pressure is currently controlled.  Pre-Diabetes Elizabeth [name] has a diagnosis of prediabetes based on her elevated Hgb A1c and was informed this puts her at greater risk of developing diabetes. She is taking metformin currently and continues to work on diet and exercise to decrease risk of diabetes. She denies any GI symptoms or hypoglycemia.  At risk for osteopenia and osteoporosis Elizabeth [name] is at higher risk of osteopenia and osteoporosis due to vitamin D deficiency.    ALLERGIES: Allergies  Allergen Reactions  . Food Other (See Comments)    Strawberries: itching   . Imuran [Azathioprine] Other (See Comments)    Made me hurt all over     MEDICATIONS: Current Outpatient Medications on File Prior to Visit  Medication Sig Dispense Refill  . amLODipine (NORVASC) 5 MG tablet Take 2.5 mg by mouth daily.     Marland Kitchen aspirin 81 MG tablet Take 81 mg by mouth daily.    . Cholecalciferol (VITAMIN D3) 2000 units TABS Take 2,000 Units by mouth daily.    . fluticasone  (FLONASE) 50 MCG/ACT nasal spray Place 2 sprays into both nostrils daily.    . hydroxychloroquine (PLAQUENIL) 200 MG tablet Take 200 mg by mouth 2 (two) times daily. Take 1 tablet in the morning then half a tablet a night     . meloxicam (MOBIC) 7.5 MG tablet Take 7.5 mg by mouth daily as needed for pain.    . Multiple Vitamins-Minerals (MULTIVITAMIN WOMEN 50+) TABS Take 1 tablet by mouth daily.    Marland Kitchen telmisartan-hydrochlorothiazide (MICARDIS HCT) 40-12.5 MG tablet Take 1 tablet by mouth daily.    . Turmeric 400 MG CAPS Take 1 capsule by mouth daily.     No current facility-administered medications on file prior to visit.     PAST MEDICAL HISTORY: Past Medical History:  Diagnosis Date  . Abnormal EKG   . Anemia    slight   . Arthritis   . 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 Back pain   . Depression   . Gallbladder problem   . Headache    left sided headache   . Hypertension   . Joint pain   . Obesity   . OSA (obstructive sleep apnea)    CPAP- has not used 6 months to a year since used   . Osteoarthritis   . Shortness of breath dyspnea    with exertion   . Sjogren's syndrome (HCC) 03/22/2011   Dev[place]war  .  Undifferentiated connective tissue disease (HCC)   . Vitamin D deficiency     PAST SURGICAL HISTORY: Past Surgical History:  Procedure Laterality Date  . ABDOMINAL HYSTERECTOMY    . ARTERY BIOPSY Left 06/09/2015   Procedure: LEFT TEMPORAL ARTERY BIOPSY;  Surgeon: Darnell Level, MD;  Location: [facility];  Service: General;  Laterality: Left;  . CHOLECYSTECTOMY    . paratoid tumor removed      SOCIAL HISTORY: Social History   Tobacco Use  . Smoking status: Never Smoker  . Smokeless tobacco: Never Used  Substance Use Topics  . Alcohol use: No  . Drug use: No    FAMILY HISTORY: Family History  Problem Relation Age of Onset  . Arthritis Mother   . Diabetes Mother   . Hypertension Mother   . Stroke Mother   . Obesity Mother   . Heart disease  Father   . Hypertension Father   . Stroke Father   . Obesity Father   . Sleep apnea Father   . Hypertension Brother   . Asthma Paternal Grandmother     ROS: Review of Systems  Constitutional: Positive for weight loss.  Cardiovascular: Negative for chest pain.       Negative for chest pressure  Gastrointestinal: Negative for diarrhea, nausea and vomiting.  Neurological: Negative for headaches.  Endo/Heme/Allergies:       Negative for hypoglycemia    PHYSICAL EXAM: Blood pressure 122/75, pulse (!) 59, temperature 98.5 F (36.9 C), temperature source Oral, height  (1.6 m), weight 242 lb (109.8 kg), SpO2 98 %. Body mass index is 42.87 kg/m. Physical Exam  Constitutional: She is oriented to person, place, and time. She appears well-developed and well-nourished.  Cardiovascular: Normal rate.  Pulmonary/Chest: Effort normal.  Musculoskeletal: Normal range of motion.  Neurological: She is oriented to person, place, and time.  Skin: Skin is warm and dry.  Psychiatric: She has a normal mood and affect. Her behavior is normal.  Vitals reviewed.   RECENT LABS AND TESTS: BMET    Component Value Date/Time   NA 140 06/12/2017 1224   NA 141 06/03/2015 1459   K 4.2 06/12/2017 1224   K 3.9 06/03/2015 1459   CL 97 06/12/2017 1224   CO2 25 06/12/2017 1224   CO2 25 06/03/2015 1459   GLUCOSE 86 06/12/2017 1224   GLUCOSE 149 (H) 06/03/2015 1459   BUN 13 06/12/2017 1224   BUN 21.6 06/03/2015 1459   CREATININE 0.84 06/12/2017 1224   CREATININE 0.9 06/03/2015 1459   CALCIUM 9.3 06/12/2017 1224   CALCIUM 8.3 (L) 06/03/2015 1459   GFRNONAA 73 06/12/2017 1224   GFRAA 84 06/12/2017 1224   Lab Results  Component Value Date   HGBA1C 5.9 (H) 06/12/2017   Lab Results  Component Value Date   INSULIN 39.0 (H) 06/12/2017   CBC    Component Value Date/Time   WBC 5.8 06/12/2017 1224   WBC 16.3 (H) 06/04/2015 1335   RBC 5.08 06/12/2017 1224   RBC 4.33 06/04/2015 1335   HGB 13.0  06/12/2017 1224   HGB 11.0 (L) 06/03/2015 1459   HCT 38.7 06/12/2017 1224   HCT 34.3 (L) 06/03/2015 1459   PLT 301 06/12/2017 1224   MCV 76 (L) 06/12/2017 1224   MCV 79.2 (L) 06/03/2015 1459   MCH 25.6 (L) 06/12/2017 1224   MCH 24.9 (L) 06/04/2015 1335   MCHC 33.6 06/12/2017 1224   MCHC 30.5 06/04/2015 1335   RDW 16.2 (H) 06/12/2017 1224  RDW 18.6 (H) 06/03/2015 1459   LYMPHSABS 1.2 06/12/2017 1224   LYMPHSABS 1.8 06/03/2015 1459   MONOABS 0.9 06/03/2015 1459   EOSABS 0.2 06/12/2017 1224   BASOSABS 0.0 06/12/2017 1224   BASOSABS 0.0 06/03/2015 1459   Iron/TIBC/Ferritin/ %Sat    Component Value Date/Time   IRON 63 06/03/2015 1459   TIBC 230 (L) 06/03/2015 1459   FERRITIN 674 (H) 06/03/2015 1459   IRONPCTSAT 27 06/03/2015 1459   Lipid Panel     Component Value Date/Time   CHOL 155 06/12/2017 1224   TRIG 115 06/12/2017 1224   HDL 37 (L) 06/12/2017 1224   CHOLHDL 4.2 06/12/2017 1224   LDLCALC 95 06/12/2017 1224   Hepatic Function Panel     Component Value Date/Time   PROT 8.9 (H) 06/12/2017 1224   PROT 7.5 06/03/2015 1459   ALBUMIN 4.2 06/12/2017 1224   ALBUMIN 3.0 (L) 06/03/2015 1459   AST 17 06/12/2017 1224   AST 15 06/03/2015 1459   ALT 12 06/12/2017 1224   ALT 33 06/03/2015 1459   ALKPHOS 62 06/12/2017 1224   ALKPHOS 53 06/03/2015 1459   BILITOT 0.5 06/12/2017 1224   BILITOT <0.30 06/03/2015 1459      Component Value Date/Time   TSH 1.510 06/12/2017 1224   Results for Iqbal, Tamiya A (MRN [id number]) as of 08/10/2017 11:51  Ref. Range 06/12/2017 12:24  Vitamin D, 25-Hydroxy Latest Ref Range: 30.0 - 100.0 ng/mL 56.3   ASSESSMENT AND PLAN: Essential hypertension  Prediabetes - Plan: metFORMIN (GLUCOPHAGE) 500 MG tablet  At risk for osteoporosis  Class 3 severe obesity with serious comorbidity and body mass index (BMI) of 40.0 to 44.9 in adult, unspecified obesity type (HCC)  PLAN:  Hypertension We discussed sodium restriction, working on healthy  weight loss, and a regular exercise program as the means to achieve improved blood pressure control. Kenyanna agreed with this plan and agreed to follow up as directed. We will continue to monitor her blood pressure as well as her progress with the above lifestyle modifications. She will continue amlodipine and micardis as prescribed and will watch for signs of hypotension as she continues her lifestyle modifications.  Pre-Diabetes Cassy will continue to work on weight loss, exercise, and decreasing simple carbohydrates in her diet to help decrease the risk of diabetes. We dicussed metformin including benefits and risks. She was informed that eating too many simple carbohydrates or too many calories at one sitting increases the likelihood of GI side effects. Essica agrees to continue metformin 500 mg qAM #30 with no refills. Jadira agreed to follow up with [place] as directed to monitor her progress.  At risk for osteopenia and osteoporosis Brandalynn is at risk for osteopenia and osteoporosis due to her vitamin D deficiency. She was encouraged to take her vitamin D and follow her higher calcium diet and increase strengthening exercise to help strengthen her bones and decrease her risk of osteopenia and osteoporosis.  Obesity Ryann is currently in the action stage of change. As such, her goal is to continue with weight loss efforts She has agreed to follow the Pescatarian eating plan Fatema has been instructed to work up to a goal of 150 minutes of combined cardio and strengthening exercise per week for weight loss and overall health benefits. We discussed the following Behavioral Modification Strategies today: planning for success, better snacking choices, increasing lean protein intake, increasing vegetables and work on meal planning and easy cooking plans  Makalynn has agreed to follow up with  our clinic in 2 weeks. She was informed of the importance of frequent follow up visits to maximize her  success with intensive lifestyle modifications for her multiple health conditions.   OBESITY BEHAVIORAL INTERVENTION VISIT  Today's visit was # 5 out of 22.  Starting weight: 258 lbs Starting date: 06/12/17 Today's weight : 242 lbs Today's date: 08/10/2017 Total lbs lost to date: 16 (Patients must lose 7 lbs in the first 6 months to continue with counseling)   ASK: We discussed the diagnosis of obesity with [name] today and Blaze agreed to give [place] permission to discuss obesity behavioral modification therapy today.  ASSESS: Kerria has the diagnosis of obesity and her BMI today is 42.88 Aleea is in the action stage of change   ADVISE: Chantia was educated on the multiple health risks of obesity as well as the benefit of weight loss to improve her health. She was advised of the need for long term treatment and the importance of lifestyle modifications.  AGREE: Multiple dietary modification options and treatment options were discussed and  Nanako agreed to the above obesity treatment plan.  I, [place] Crane, am acting as transcriptionist for Filbert Schilder, MD  I have reviewed the above documentation for accuracy and completeness, and I agree with the above. - Debbra Riding, MD

## 2017-08-11 ENCOUNTER — Ambulatory Visit
Admission: RE | Admit: 2017-08-11 | Discharge: 2017-08-11 | Disposition: A | Discharge status: Home / Self Care | Payer: PPO | Source: Ambulatory Visit | Attending: Family Medicine | Admitting: Family Medicine

## 2017-08-11 DIAGNOSIS — R222 Localized swelling, mass and lump, trunk: Secondary | ICD-10-CM | POA: Diagnosis not present

## 2017-08-11 DIAGNOSIS — M7989 Other specified soft tissue disorders: Secondary | ICD-10-CM

## 2017-08-18 ENCOUNTER — Other Ambulatory Visit (INDEPENDENT_AMBULATORY_CARE_PROVIDER_SITE_OTHER): Payer: Self-pay | Admitting: Family Medicine

## 2017-08-18 DIAGNOSIS — R7303 Prediabetes: Secondary | ICD-10-CM

## 2017-08-20 ENCOUNTER — Encounter (INDEPENDENT_AMBULATORY_CARE_PROVIDER_SITE_OTHER): Payer: Self-pay | Admitting: Family Medicine

## 2017-08-29 DIAGNOSIS — G4733 Obstructive sleep apnea (adult) (pediatric): Secondary | ICD-10-CM | POA: Diagnosis not present

## 2017-08-31 ENCOUNTER — Ambulatory Visit (INDEPENDENT_AMBULATORY_CARE_PROVIDER_SITE_OTHER): Payer: PPO | Admitting: Physician Assistant

## 2017-08-31 VITALS — BP 123/73 | HR 65 | Temp 99.8°F | Ht 63.0 in | Wt 237.0 lb

## 2017-08-31 DIAGNOSIS — Z9189 Other specified personal risk factors, not elsewhere classified: Secondary | ICD-10-CM

## 2017-08-31 DIAGNOSIS — R7303 Prediabetes: Secondary | ICD-10-CM | POA: Diagnosis not present

## 2017-08-31 DIAGNOSIS — I10 Essential (primary) hypertension: Secondary | ICD-10-CM

## 2017-08-31 DIAGNOSIS — Z6841 Body Mass Index (BMI) 40.0 and over, adult: Secondary | ICD-10-CM | POA: Diagnosis not present

## 2017-08-31 NOTE — Progress Notes (Addendum)
Office: [phone]  /  Fax: [phone]   HPI:   Chief Complaint: OBESITY Elizabeth [name] is here to discuss her progress with her obesity treatment plan. She is on the Pescatarian eating plan and is following her eating plan approximately 50 % of the time. She states she is riding stationary bike, walking, and training for 30 minutes 2 times per week. Elizabeth [name] continues to do well with weight loss. She would like to go back to the Category meal plan.  Her weight is 237 lb (107.5 kg) today and has had a weight loss of 5 pounds over a period of 3 weeks since her last visit. She has lost 21 lbs since starting treatment with us.  Pre-Diabetes Elizabeth [name] has a diagnosis of pre-diabetes based on her elevated Hgb A1c and was informed this puts her at greater risk of developing diabetes. She relates that metformin has caused her to have a rash over her arms and neck and thus has not been taking it. The rash has resolved with stopping Metformin. She continues to work on diet and exercise to decrease risk of diabetes. She denies nausea or hypoglycemia.  At risk for diabetes Elizabeth [name] is at higher than average risk for developing diabetes due to her obesity and pre-diabetes. She currently denies polyuria or polydipsia.  Hypertension Elizabeth [name] is a 67 y.o. female with hypertension. Elizabeth [name]'s blood pressure is stable and she denies chest pain or shortness of breath. She is working weight loss to help control her blood pressure with the goal of decreasing her risk of heart attack and stroke. Elizabeth [name]'s blood pressure is currently controlled.  ALLERGIES: Allergies  Allergen Reactions  . Food Other (See Comments)    Strawberries: itching   . Imuran [Azathioprine] Other (See Comments)    Made me hurt all over     MEDICATIONS: Current Outpatient Medications on File Prior to Visit  Medication Sig Dispense Refill  . aspirin 81 MG tablet Take 81 mg by mouth daily.    . Cholecalciferol (VITAMIN D3) 2000  units TABS Take 2,000 Units by mouth daily.    . fluticasone (FLONASE) 50 MCG/ACT nasal spray Place 2 sprays into both nostrils daily.    . hydroxychloroquine (PLAQUENIL) 200 MG tablet Take 200 mg by mouth 2 (two) times daily. Take 1 tablet in the morning then half a tablet a night     . meloxicam (MOBIC) 7.5 MG tablet Take 7.5 mg by mouth daily as needed for pain.    . Multiple Vitamins-Minerals (MULTIVITAMIN WOMEN 50+) TABS Take 1 tablet by mouth daily.    Marland Kitchen. telmisartan-hydrochlorothiazide (MICARDIS HCT) 40-12.5 MG tablet Take 1 tablet by mouth daily.    . Turmeric 400 MG CAPS Take 1 capsule by mouth daily.     No current facility-administered medications on file prior to visit.     PAST MEDICAL HISTORY: Past Medical History:  Diagnosis Date  . Abnormal EKG   . Anemia    slight   . Arthritis   . 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 Back pain   . Depression   . Gallbladder problem   . Headache    left sided headache   . Hypertension   . Joint pain   . Obesity   . OSA (obstructive sleep apnea)    CPAP- has not used 6 months to a year since used   . Osteoarthritis   . Shortness of breath dyspnea    with exertion   .  Sjogren's syndrome (HCC) 03/22/2011   [place]  . Undifferentiated connective tissue disease (HCC)   . Vitamin D deficiency     PAST SURGICAL HISTORY: Past Surgical History:  Procedure Laterality Date  . ABDOMINAL HYSTERECTOMY    . ARTERY BIOPSY Left 06/09/2015   Procedure: LEFT TEMPORAL ARTERY BIOPSY;  Surgeon: Darnell Level, MD;  Location: [facility];  Service: General;  Laterality: Left;  . CHOLECYSTECTOMY    . paratoid tumor removed      SOCIAL HISTORY: Social History   Tobacco Use  . Smoking status: Never Smoker  . Smokeless tobacco: Never Used  Substance Use Topics  . Alcohol use: No  . Drug use: No    FAMILY HISTORY: Family History  Problem Relation Age of Onset  . Arthritis Mother   . Diabetes Mother   . Hypertension  Mother   . Stroke Mother   . Obesity Mother   . Heart disease Father   . Hypertension Father   . Stroke Father   . Obesity Father   . Sleep apnea Father   . Hypertension Brother   . Asthma Paternal Grandmother     ROS: Review of Systems  Constitutional: Positive for weight loss.  Respiratory: Negative for shortness of breath.   Cardiovascular: Negative for chest pain.  Gastrointestinal: Negative for nausea.  Genitourinary: Negative for frequency.  Endo/Heme/Allergies: Negative for polydipsia.       Negative hypoglycemia    PHYSICAL EXAM: Blood pressure 123/73, pulse 65, temperature 99.8 F (37.7 C), temperature source Oral, height 5\' 3"  (1.6 m), weight 237 lb (107.5 kg), SpO2 95 %. Body mass index is 41.98 kg/m. Physical Exam  Constitutional: She is oriented to person, place, and time. She appears well-developed and well-nourished.  Cardiovascular: Normal rate.  Pulmonary/Chest: Effort normal.  Musculoskeletal: Normal range of motion.  Neurological: She is oriented to person, place, and time.  Skin: Skin is warm and dry.  Psychiatric: She has a normal mood and affect. Her behavior is normal.  Vitals reviewed.   RECENT LABS AND TESTS: BMET    Component Value Date/Time   NA 140 06/12/2017 1224   NA 141 06/03/2015 1459   K 4.2 06/12/2017 1224   K 3.9 06/03/2015 1459   CL 97 06/12/2017 1224   CO2 25 06/12/2017 1224   CO2 25 06/03/2015 1459   GLUCOSE 86 06/12/2017 1224   GLUCOSE 149 (H) 06/03/2015 1459   BUN 13 06/12/2017 1224   BUN 21.6 06/03/2015 1459   CREATININE 0.84 06/12/2017 1224   CREATININE 0.9 06/03/2015 1459   CALCIUM 9.3 06/12/2017 1224   CALCIUM 8.3 (L) 06/03/2015 1459   GFRNONAA 73 06/12/2017 1224   GFRAA 84 06/12/2017 1224   Lab Results  Component Value Date   HGBA1C 5.9 (H) 06/12/2017   Lab Results  Component Value Date   INSULIN 39.0 (H) 06/12/2017   CBC    Component Value Date/Time   WBC 5.8 06/12/2017 1224   WBC 16.3 (H)  06/04/2015 1335   RBC 5.08 06/12/2017 1224   RBC 4.33 06/04/2015 1335   HGB 13.0 06/12/2017 1224   HGB 11.0 (L) 06/03/2015 1459   HCT 38.7 06/12/2017 1224   HCT 34.3 (L) 06/03/2015 1459   PLT 301 06/12/2017 1224   MCV 76 (L) 06/12/2017 1224   MCV 79.2 (L) 06/03/2015 1459   MCH 25.6 (L) 06/12/2017 1224   MCH 24.9 (L) 06/04/2015 1335   MCHC 33.6 06/12/2017 1224   MCHC 30.5 06/04/2015 1335   RDW  16.2 (H) 06/12/2017 1224   RDW 18.6 (H) 06/03/2015 1459   LYMPHSABS 1.2 06/12/2017 1224   LYMPHSABS 1.8 06/03/2015 1459   MONOABS 0.9 06/03/2015 1459   EOSABS 0.2 06/12/2017 1224   BASOSABS 0.0 06/12/2017 1224   BASOSABS 0.0 06/03/2015 1459   Iron/TIBC/Ferritin/ %Sat    Component Value Date/Time   IRON 63 06/03/2015 1459   TIBC 230 (L) 06/03/2015 1459   FERRITIN 674 (H) 06/03/2015 1459   IRONPCTSAT 27 06/03/2015 1459   Lipid Panel     Component Value Date/Time   CHOL 155 06/12/2017 1224   TRIG 115 06/12/2017 1224   HDL 37 (L) 06/12/2017 1224   CHOLHDL 4.2 06/12/2017 1224   LDLCALC 95 06/12/2017 1224   Hepatic Function Panel     Component Value Date/Time   PROT 8.9 (H) 06/12/2017 1224   PROT 7.5 06/03/2015 1459   ALBUMIN 4.2 06/12/2017 1224   ALBUMIN 3.0 (L) 06/03/2015 1459   AST 17 06/12/2017 1224   AST 15 06/03/2015 1459   ALT 12 06/12/2017 1224   ALT 33 06/03/2015 1459   ALKPHOS 62 06/12/2017 1224   ALKPHOS 53 06/03/2015 1459   BILITOT 0.5 06/12/2017 1224   BILITOT <0.30 06/03/2015 1459      Component Value Date/Time   TSH 1.510 06/12/2017 1224    ASSESSMENT AND PLAN: Prediabetes  Essential hypertension  At risk for diabetes mellitus  Class 3 severe obesity with serious comorbidity and body mass index (BMI) of 40.0 to 44.9 in adult, unspecified obesity type (HCC)  PLAN:  Pre-Diabetes Elizabeth [name] will continue to work on weight loss, exercise, and decreasing simple carbohydrates in her diet to help decrease the risk of diabetes. We dicussed metformin  including benefits and risks. She was informed that eating too many simple carbohydrates or too many calories at one sitting increases the likelihood of GI side effects. Elizabeth [name] agrees to stop metformin and she agrees to follow up with our clinic in 2 weeks as directed to monitor her progress.  Diabetes risk counselling Elizabeth [name] was given extended (15 minutes) diabetes prevention counseling today. She is 67 y.o. female and has risk factors for diabetes including obesity and pre-diabetes. We discussed intensive lifestyle modifications today with an emphasis on weight loss as well as increasing exercise and decreasing simple carbohydrates in her diet.  Hypertension We discussed sodium restriction, working on healthy weight loss, and a regular exercise program as the means to achieve improved blood pressure control. Elizabeth [name] agreed with this plan and agreed to follow up as directed. We will continue to monitor her blood pressure as well as her progress with the above lifestyle modifications. She will continue her medications as prescribed and will watch for signs of hypotension as she continues her lifestyle modifications. Elizabeth [name] agrees to follow up with our clinic in 2 weeks.  Obesity Elizabeth [name] is currently in the action stage of change. As such, her goal is to continue with weight loss efforts She has agreed to follow the Category 2 plan Elizabeth [name] has been instructed to work up to a goal of 150 minutes of combined cardio and strengthening exercise per week for weight loss and overall health benefits. We discussed the following Behavioral Modification Strategies today: increasing lean protein intake and work on meal planning and easy cooking plans   Elizabeth [name] has agreed to follow up with our clinic in 2 weeks. She was informed of the importance of frequent follow up visits to maximize her success with intensive lifestyle modifications for her multiple  health conditions.   OBESITY BEHAVIORAL INTERVENTION  VISIT  Today's visit was # 6 out of 22.  Starting weight: 258 lbs Starting date: 06/12/17 Today's weight : 237 lbs Today's date: 09/04/2017 Total lbs lost to date: 21 (Patients must lose 7 lbs in the first 6 months to continue with counseling)   ASK: We discussed the diagnosis of obesity with Elizabeth [name] today and Elizabeth [name] agreed to give [place] permission to discuss obesity behavioral modification therapy today.  ASSESS: Elizabeth [name] has the diagnosis of obesity and her BMI today is 41.99 Elizabeth [name] is in the action stage of change   ADVISE: Elizabeth [name] was educated on the multiple health risks of obesity as well as the benefit of weight loss to improve her health. She was advised of the need for long term treatment and the importance of lifestyle modifications.  AGREE: Multiple dietary modification options and treatment options were discussed and  Elizabeth [name] agreed to the above obesity treatment plan.   Elizabeth [name], am acting as transcriptionist for Illa Level, PA-C I, Illa Level [facility], have reviewed this note and agree with its content

## 2017-09-05 DIAGNOSIS — J029 Acute pharyngitis, unspecified: Secondary | ICD-10-CM | POA: Diagnosis not present

## 2017-09-05 DIAGNOSIS — J069 Acute upper respiratory infection, unspecified: Secondary | ICD-10-CM | POA: Diagnosis not present

## 2017-09-14 ENCOUNTER — Ambulatory Visit (INDEPENDENT_AMBULATORY_CARE_PROVIDER_SITE_OTHER): Payer: PPO | Admitting: Physician Assistant

## 2017-09-27 ENCOUNTER — Ambulatory Visit (INDEPENDENT_AMBULATORY_CARE_PROVIDER_SITE_OTHER): Payer: PPO | Admitting: Physician Assistant

## 2017-09-27 ENCOUNTER — Other Ambulatory Visit (INDEPENDENT_AMBULATORY_CARE_PROVIDER_SITE_OTHER): Payer: Self-pay | Admitting: Physician Assistant

## 2017-09-27 VITALS — BP 128/81 | HR 59 | Temp 98.5°F | Ht 63.0 in | Wt 239.0 lb

## 2017-09-27 DIAGNOSIS — R7303 Prediabetes: Secondary | ICD-10-CM

## 2017-09-27 DIAGNOSIS — Z6841 Body Mass Index (BMI) 40.0 and over, adult: Secondary | ICD-10-CM | POA: Diagnosis not present

## 2017-09-27 DIAGNOSIS — I10 Essential (primary) hypertension: Secondary | ICD-10-CM | POA: Diagnosis not present

## 2017-09-27 DIAGNOSIS — E66813 Obesity, class 3: Secondary | ICD-10-CM

## 2017-09-27 MED ORDER — TELMISARTAN 20 MG PO TABS: 20.0000 mg | ORAL_TABLET | Freq: Every day | ORAL | 0 refills | Status: DC

## 2017-09-27 MED ORDER — HYDROCHLOROTHIAZIDE 12.5 MG PO TABS: 12.5000 mg | ORAL_TABLET | Freq: Every day | ORAL | 0 refills | Status: DC

## 2017-09-27 NOTE — Progress Notes (Signed)
Office: [phone]  /  Fax: [phone]   HPI:   Chief Complaint: OBESITY Elizabeth [name] is here to discuss her progress with her obesity treatment plan. She is on the Category 2 plan and is following her eating plan approximately 50 % of the time. She states she is doing cardio and exercising with a trainer for 90 minutes 2 times per week. Brihany has not been eating the recommended protein on her meal plan. Her weight is 239 lb (108.4 kg) today and has had a weight gain of 2 pounds over a period of 3 to 4 weeks since her last visit. She has lost 19 lbs since starting treatment with [place].  Hypertension Elizabeth [name] is a 67 y.o. female with hypertension. Patient states blood pressure at home has been low, with systolic in the 110's and diastolic in the 50's. Elizabeth [name] denies chest pain or shortness of breath on exertion. She is working weight loss to help control her blood pressure with the goal of decreasing her risk of heart attack and stroke. Elizabeth [name] blood pressure is stable.  Pre-Diabetes Elizabeth [name] has a diagnosis of prediabetes based on her elevated Hgb A1c and was informed this puts her at greater risk of developing diabetes. She stopped taking metformin due to rash, which has resolved after stopping Metformin. Elizabeth [name] continues to work on diet and exercise to decrease risk of diabetes. She denies nausea, polyphagia or hypoglycemia.  ALLERGIES: Allergies  Allergen Reactions  . Food Other (See Comments)    Strawberries: itching   . Imuran [Azathioprine] Other (See Comments)    Made me hurt all over     MEDICATIONS: Current Outpatient Medications on File Prior to Visit  Medication Sig Dispense Refill  . aspirin 81 MG tablet Take 81 mg by mouth daily.    . Cholecalciferol (VITAMIN D3) 2000 units TABS Take 2,000 Units by mouth daily.    . fluticasone (FLONASE) 50 MCG/ACT nasal spray Place 2 sprays into both nostrils daily.    . hydroxychloroquine (PLAQUENIL) 200 MG tablet Take  200 mg by mouth 2 (two) times daily. Take 1 tablet in the morning then half a tablet a night     . meloxicam (MOBIC) 7.5 MG tablet Take 7.5 mg by mouth daily as needed for pain.    . Multiple Vitamins-Minerals (MULTIVITAMIN WOMEN 50+) TABS Take 1 tablet by mouth daily.    . Turmeric 400 MG CAPS Take 1 capsule by mouth daily.     No current facility-administered medications on file prior to visit.     PAST MEDICAL HISTORY: Past Medical History:  Diagnosis Date  . Abnormal EKG   . Anemia    slight   . Arthritis   . 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 Back pain   . Depression   . Gallbladder problem   . Headache    left sided headache   . Hypertension   . Joint pain   . Obesity   . OSA (obstructive sleep apnea)    CPAP- has not used 6 months to a year since used   . Osteoarthritis   . Shortness of breath dyspnea    with exertion   . Sjogren's syndrome (HCC) 03/22/2011   Elizabeth [name]  . Undifferentiated connective tissue disease (HCC)   . Vitamin D deficiency     PAST SURGICAL HISTORY: Past Surgical History:  Procedure Laterality Date  . ABDOMINAL HYSTERECTOMY    . ARTERY BIOPSY Left 06/09/2015  Procedure: LEFT TEMPORAL ARTERY BIOPSY;  Surgeon: Darnell Levelodd Gerkin, MD;  Location: [facility];  Service: General;  Laterality: Left;  . CHOLECYSTECTOMY    . paratoid tumor removed      SOCIAL HISTORY: Social History   Tobacco Use  . Smoking status: Never Smoker  . Smokeless tobacco: Never Used  Substance Use Topics  . Alcohol use: No  . Drug use: No    FAMILY HISTORY: Family History  Problem Relation Age of Onset  . Arthritis Mother   . Diabetes Mother   . Hypertension Mother   . Stroke Mother   . Obesity Mother   . Heart disease Father   . Hypertension Father   . Stroke Father   . Obesity Father   . Sleep apnea Father   . Hypertension Brother   . Asthma Paternal Grandmother     ROS: Review of Systems  Constitutional: Negative for weight loss.    Respiratory: Negative for shortness of breath (on exertion).   Cardiovascular: Negative for chest pain.  Gastrointestinal: Negative for nausea.  Endo/Heme/Allergies:       Negative for hypoglycemia Negative for polyphagia    PHYSICAL EXAM: Blood pressure 128/81, pulse (!) 59, temperature 98.5 F (36.9 C), temperature source Oral, height 5\' 3"  (1.6 m), weight 239 lb (108.4 kg), SpO2 99 %. Body mass index is 42.34 kg/m. Physical Exam  Constitutional: She is oriented to person, place, and time. She appears well-developed and well-nourished.  Cardiovascular: Bradycardia present.  Pulmonary/Chest: Effort normal.  Musculoskeletal: Normal range of motion.  Neurological: She is oriented to person, place, and time.  Skin: Skin is warm and dry.  Psychiatric: She has a normal mood and affect. Her behavior is normal.  Vitals reviewed.   RECENT LABS AND TESTS: BMET    Component Value Date/Time   NA 140 06/12/2017 1224   NA 141 06/03/2015 1459   K 4.2 06/12/2017 1224   K 3.9 06/03/2015 1459   CL 97 06/12/2017 1224   CO2 25 06/12/2017 1224   CO2 25 06/03/2015 1459   GLUCOSE 86 06/12/2017 1224   GLUCOSE 149 (H) 06/03/2015 1459   BUN 13 06/12/2017 1224   BUN 21.6 06/03/2015 1459   CREATININE 0.84 06/12/2017 1224   CREATININE 0.9 06/03/2015 1459   CALCIUM 9.3 06/12/2017 1224   CALCIUM 8.3 (L) 06/03/2015 1459   GFRNONAA 73 06/12/2017 1224   GFRAA 84 06/12/2017 1224   Lab Results  Component Value Date   HGBA1C 5.9 (H) 06/12/2017   Lab Results  Component Value Date   INSULIN 39.0 (H) 06/12/2017   CBC    Component Value Date/Time   WBC 5.8 06/12/2017 1224   WBC 16.3 (H) 06/04/2015 1335   RBC 5.08 06/12/2017 1224   RBC 4.33 06/04/2015 1335   HGB 13.0 06/12/2017 1224   HGB 11.0 (L) 06/03/2015 1459   HCT 38.7 06/12/2017 1224   HCT 34.3 (L) 06/03/2015 1459   PLT 301 06/12/2017 1224   MCV 76 (L) 06/12/2017 1224   MCV 79.2 (L) 06/03/2015 1459   MCH 25.6 (L) 06/12/2017 1224    MCH 24.9 (L) 06/04/2015 1335   MCHC 33.6 06/12/2017 1224   MCHC 30.5 06/04/2015 1335   RDW 16.2 (H) 06/12/2017 1224   RDW 18.6 (H) 06/03/2015 1459   LYMPHSABS 1.2 06/12/2017 1224   LYMPHSABS 1.8 06/03/2015 1459   MONOABS 0.9 06/03/2015 1459   EOSABS 0.2 06/12/2017 1224   BASOSABS 0.0 06/12/2017 1224   BASOSABS 0.0 06/03/2015 1459  Iron/TIBC/Ferritin/ %Sat    Component Value Date/Time   IRON 63 06/03/2015 1459   TIBC 230 (L) 06/03/2015 1459   FERRITIN 674 (H) 06/03/2015 1459   IRONPCTSAT 27 06/03/2015 1459   Lipid Panel     Component Value Date/Time   CHOL 155 06/12/2017 1224   TRIG 115 06/12/2017 1224   HDL 37 (L) 06/12/2017 1224   CHOLHDL 4.2 06/12/2017 1224   LDLCALC 95 06/12/2017 1224   Hepatic Function Panel     Component Value Date/Time   PROT 8.9 (H) 06/12/2017 1224   PROT 7.5 06/03/2015 1459   ALBUMIN 4.2 06/12/2017 1224   ALBUMIN 3.0 (L) 06/03/2015 1459   AST 17 06/12/2017 1224   AST 15 06/03/2015 1459   ALT 12 06/12/2017 1224   ALT 33 06/03/2015 1459   ALKPHOS 62 06/12/2017 1224   ALKPHOS 53 06/03/2015 1459   BILITOT 0.5 06/12/2017 1224   BILITOT <0.30 06/03/2015 1459      Component Value Date/Time   TSH 1.510 06/12/2017 1224   Results for Dubach, Torsha A (MRN [id number]) as of 09/27/2017 15:38  Ref. Range 06/12/2017 12:24  Vitamin D, 25-Hydroxy Latest Ref Range: 30.0 - 100.0 ng/mL 56.3   ASSESSMENT AND PLAN: Essential hypertension - Plan: hydrochlorothiazide (HYDRODIURIL) 12.5 MG tablet  Prediabetes  Class 3 severe obesity with serious comorbidity and body mass index (BMI) of 40.0 to 44.9 in adult, unspecified obesity type (HCC)  PLAN:  Hypertension We discussed sodium restriction, working on healthy weight loss, and a regular exercise program as the means to achieve improved blood pressure control. Elizabeth [name] agreed with this plan and agreed to follow up as directed. We will continue to monitor her blood pressure as well as her progress with  the above lifestyle modifications. She agrees to stop Telmisartan-HCTZ and continue to take HCTZ 12.5 mg qd #30 with no refills and decrease Telmisartan to 20 mg qd #30 with no refills. Elizabeth [name] will watch for signs of hypotension as she continues her lifestyle modifications.  Pre-Diabetes Elizabeth [name] will continue to work on weight loss, exercise, and decreasing simple carbohydrates in her diet to help decrease the risk of diabetes. She was informed that eating too many simple carbohydrates or too many calories at one sitting increases the likelihood of GI side effects. Elizabeth [name] agreed to follow up with [place] as directed to monitor her progress.  Obesity Meiah is currently in the action stage of change. As such, her goal is to continue with weight loss efforts She has agreed to follow the Category 2 plan Elizabeth [name] has been instructed to work up to a goal of 150 minutes of combined cardio and strengthening exercise per week for weight loss and overall health benefits. We discussed the following Behavioral Modification Strategies today: increasing lean protein intake and keep a strict food journal  Elizabeth [name] has agreed to follow up with our clinic in 3 weeks. She was informed of the importance of frequent follow up visits to maximize her success with intensive lifestyle modifications for her multiple health conditions.   OBESITY BEHAVIORAL INTERVENTION VISIT  Today's visit was # 7 out of 22.  Starting weight: 258 lbs Starting date: 06/12/17 Today's weight : 239 lbs  Today's date: 09/27/2017 Total lbs lost to date: 43 (Patients must lose 7 lbs in the first 6 months to continue with counseling)   ASK: We discussed the diagnosis of obesity with Elizabeth [name] today and Elizabeth [name] agreed to give [place] permission to discuss obesity behavioral modification therapy today.  ASSESS: Elizabeth [name] has the diagnosis of obesity and her BMI today is 42.35 Sunjai is in the action stage of change   ADVISE: Elizabeth [name] was  educated on the multiple health risks of obesity as well as the benefit of weight loss to improve her health. She was advised of the need for long term treatment and the importance of lifestyle modifications.  AGREE: Multiple dietary modification options and treatment options were discussed and  Elizabeth [name] agreed to the above obesity treatment plan.   Elizabeth [name], am acting as transcriptionist for [employer], PA-C I, Illa Level [facility], have reviewed this note and agree with its content

## 2017-09-28 DIAGNOSIS — G4733 Obstructive sleep apnea (adult) (pediatric): Secondary | ICD-10-CM | POA: Diagnosis not present

## 2017-10-18 ENCOUNTER — Ambulatory Visit (INDEPENDENT_AMBULATORY_CARE_PROVIDER_SITE_OTHER): Payer: PPO | Admitting: Family Medicine

## 2017-10-24 ENCOUNTER — Other Ambulatory Visit (INDEPENDENT_AMBULATORY_CARE_PROVIDER_SITE_OTHER): Payer: Self-pay | Admitting: Physician Assistant

## 2017-10-24 DIAGNOSIS — I10 Essential (primary) hypertension: Secondary | ICD-10-CM

## 2017-10-29 DIAGNOSIS — G4733 Obstructive sleep apnea (adult) (pediatric): Secondary | ICD-10-CM | POA: Diagnosis not present

## 2017-11-09 DIAGNOSIS — I10 Essential (primary) hypertension: Secondary | ICD-10-CM | POA: Diagnosis not present

## 2017-11-29 DIAGNOSIS — G4733 Obstructive sleep apnea (adult) (pediatric): Secondary | ICD-10-CM | POA: Diagnosis not present

## 2017-12-29 DIAGNOSIS — G4733 Obstructive sleep apnea (adult) (pediatric): Secondary | ICD-10-CM | POA: Diagnosis not present

## 2018-01-03 DIAGNOSIS — M159 Polyosteoarthritis, unspecified: Secondary | ICD-10-CM | POA: Diagnosis not present

## 2018-01-03 DIAGNOSIS — M359 Systemic involvement of connective tissue, unspecified: Secondary | ICD-10-CM | POA: Diagnosis not present

## 2018-01-03 DIAGNOSIS — Z79899 Other long term (current) drug therapy: Secondary | ICD-10-CM | POA: Diagnosis not present

## 2018-01-03 DIAGNOSIS — Z8639 Personal history of other endocrine, nutritional and metabolic disease: Secondary | ICD-10-CM | POA: Diagnosis not present

## 2018-01-09 DIAGNOSIS — J0110 Acute frontal sinusitis, unspecified: Secondary | ICD-10-CM | POA: Diagnosis not present

## 2018-01-29 DIAGNOSIS — G4733 Obstructive sleep apnea (adult) (pediatric): Secondary | ICD-10-CM | POA: Diagnosis not present

## 2018-02-13 DIAGNOSIS — K130 Diseases of lips: Secondary | ICD-10-CM | POA: Diagnosis not present

## 2018-02-13 DIAGNOSIS — I10 Essential (primary) hypertension: Secondary | ICD-10-CM | POA: Diagnosis not present

## 2018-02-13 DIAGNOSIS — Z1389 Encounter for screening for other disorder: Secondary | ICD-10-CM | POA: Diagnosis not present

## 2018-02-13 DIAGNOSIS — Z6841 Body Mass Index (BMI) 40.0 and over, adult: Secondary | ICD-10-CM | POA: Diagnosis not present

## 2018-02-22 DIAGNOSIS — M3500 Sicca syndrome, unspecified: Secondary | ICD-10-CM | POA: Diagnosis not present

## 2018-02-22 DIAGNOSIS — H2513 Age-related nuclear cataract, bilateral: Secondary | ICD-10-CM | POA: Diagnosis not present

## 2018-02-22 DIAGNOSIS — H25013 Cortical age-related cataract, bilateral: Secondary | ICD-10-CM | POA: Diagnosis not present

## 2018-02-22 DIAGNOSIS — Z79899 Other long term (current) drug therapy: Secondary | ICD-10-CM | POA: Diagnosis not present

## 2018-02-22 DIAGNOSIS — H40013 Open angle with borderline findings, low risk, bilateral: Secondary | ICD-10-CM | POA: Diagnosis not present

## 2018-02-28 DIAGNOSIS — G4733 Obstructive sleep apnea (adult) (pediatric): Secondary | ICD-10-CM | POA: Diagnosis not present

## 2018-03-11 DIAGNOSIS — R05 Cough: Secondary | ICD-10-CM | POA: Diagnosis not present

## 2018-03-15 DIAGNOSIS — R509 Fever, unspecified: Secondary | ICD-10-CM | POA: Diagnosis not present

## 2018-03-15 DIAGNOSIS — J069 Acute upper respiratory infection, unspecified: Secondary | ICD-10-CM | POA: Diagnosis not present

## 2018-03-31 DIAGNOSIS — G4733 Obstructive sleep apnea (adult) (pediatric): Secondary | ICD-10-CM | POA: Diagnosis not present

## 2018-05-16 DIAGNOSIS — M359 Systemic involvement of connective tissue, unspecified: Secondary | ICD-10-CM | POA: Diagnosis not present

## 2018-05-16 DIAGNOSIS — I10 Essential (primary) hypertension: Secondary | ICD-10-CM | POA: Diagnosis not present

## 2018-05-21 DIAGNOSIS — Z1231 Encounter for screening mammogram for malignant neoplasm of breast: Secondary | ICD-10-CM | POA: Diagnosis not present

## 2018-06-04 DIAGNOSIS — Z79899 Other long term (current) drug therapy: Secondary | ICD-10-CM | POA: Diagnosis not present

## 2018-06-04 DIAGNOSIS — M359 Systemic involvement of connective tissue, unspecified: Secondary | ICD-10-CM | POA: Diagnosis not present

## 2018-06-04 DIAGNOSIS — R21 Rash and other nonspecific skin eruption: Secondary | ICD-10-CM | POA: Diagnosis not present

## 2018-06-04 DIAGNOSIS — K219 Gastro-esophageal reflux disease without esophagitis: Secondary | ICD-10-CM | POA: Diagnosis not present

## 2018-06-04 DIAGNOSIS — M159 Polyosteoarthritis, unspecified: Secondary | ICD-10-CM | POA: Diagnosis not present

## 2018-10-12 ENCOUNTER — Other Ambulatory Visit: Payer: Self-pay

## 2018-10-12 DIAGNOSIS — R6889 Other general symptoms and signs: Secondary | ICD-10-CM | POA: Diagnosis not present

## 2018-10-12 DIAGNOSIS — H40013 Open angle with borderline findings, low risk, bilateral: Secondary | ICD-10-CM | POA: Diagnosis not present

## 2018-10-12 DIAGNOSIS — Z20822 Contact with and (suspected) exposure to covid-19: Secondary | ICD-10-CM

## 2018-10-12 DIAGNOSIS — H04123 Dry eye syndrome of bilateral lacrimal glands: Secondary | ICD-10-CM | POA: Diagnosis not present

## 2018-10-14 LAB — NOVEL CORONAVIRUS, NAA: SARS-CoV-2, NAA: NOT DETECTED

## 2018-11-29 DIAGNOSIS — M359 Systemic involvement of connective tissue, unspecified: Secondary | ICD-10-CM | POA: Diagnosis not present

## 2018-11-29 DIAGNOSIS — I10 Essential (primary) hypertension: Secondary | ICD-10-CM | POA: Diagnosis not present

## 2018-12-05 DIAGNOSIS — M159 Polyosteoarthritis, unspecified: Secondary | ICD-10-CM | POA: Diagnosis not present

## 2018-12-05 DIAGNOSIS — Z79899 Other long term (current) drug therapy: Secondary | ICD-10-CM | POA: Diagnosis not present

## 2018-12-05 DIAGNOSIS — M359 Systemic involvement of connective tissue, unspecified: Secondary | ICD-10-CM | POA: Diagnosis not present

## 2018-12-20 DIAGNOSIS — Z23 Encounter for immunization: Secondary | ICD-10-CM | POA: Diagnosis not present

## 2018-12-27 DIAGNOSIS — K136 Irritative hyperplasia of oral mucosa: Secondary | ICD-10-CM | POA: Diagnosis not present

## 2018-12-27 DIAGNOSIS — K1321 Leukoplakia of oral mucosa, including tongue: Secondary | ICD-10-CM | POA: Diagnosis not present

## 2019-01-02 DIAGNOSIS — K1321 Leukoplakia of oral mucosa, including tongue: Secondary | ICD-10-CM | POA: Diagnosis not present

## 2019-01-02 DIAGNOSIS — K136 Irritative hyperplasia of oral mucosa: Secondary | ICD-10-CM | POA: Diagnosis not present

## 2019-04-30 ENCOUNTER — Ambulatory Visit: Payer: PPO

## 2019-05-02 ENCOUNTER — Ambulatory Visit: Payer: PPO

## 2019-06-04 DIAGNOSIS — M359 Systemic involvement of connective tissue, unspecified: Secondary | ICD-10-CM | POA: Diagnosis not present

## 2019-06-04 DIAGNOSIS — Z79899 Other long term (current) drug therapy: Secondary | ICD-10-CM | POA: Diagnosis not present

## 2019-06-04 DIAGNOSIS — M159 Polyosteoarthritis, unspecified: Secondary | ICD-10-CM | POA: Diagnosis not present

## 2019-07-31 DIAGNOSIS — Z1231 Encounter for screening mammogram for malignant neoplasm of breast: Secondary | ICD-10-CM | POA: Diagnosis not present

## 2019-08-15 DIAGNOSIS — M810 Age-related osteoporosis without current pathological fracture: Secondary | ICD-10-CM | POA: Diagnosis not present

## 2019-08-15 DIAGNOSIS — M85852 Other specified disorders of bone density and structure, left thigh: Secondary | ICD-10-CM | POA: Diagnosis not present

## 2019-08-15 DIAGNOSIS — M85851 Other specified disorders of bone density and structure, right thigh: Secondary | ICD-10-CM | POA: Diagnosis not present

## 2019-09-09 DIAGNOSIS — H40013 Open angle with borderline findings, low risk, bilateral: Secondary | ICD-10-CM | POA: Diagnosis not present

## 2019-09-09 DIAGNOSIS — M3500 Sicca syndrome, unspecified: Secondary | ICD-10-CM | POA: Diagnosis not present

## 2019-09-09 DIAGNOSIS — H04123 Dry eye syndrome of bilateral lacrimal glands: Secondary | ICD-10-CM | POA: Diagnosis not present

## 2019-09-09 DIAGNOSIS — Z79899 Other long term (current) drug therapy: Secondary | ICD-10-CM | POA: Diagnosis not present

## 2019-10-24 ENCOUNTER — Other Ambulatory Visit: Payer: Self-pay | Admitting: Family Medicine

## 2019-10-24 ENCOUNTER — Ambulatory Visit
Admission: RE | Admit: 2019-10-24 | Discharge: 2019-10-24 | Disposition: A | Discharge status: Home / Self Care | Payer: PPO | Source: Ambulatory Visit | Attending: Family Medicine | Admitting: Family Medicine

## 2019-10-24 DIAGNOSIS — S40011A Contusion of right shoulder, initial encounter: Secondary | ICD-10-CM

## 2019-10-24 DIAGNOSIS — I10 Essential (primary) hypertension: Secondary | ICD-10-CM | POA: Diagnosis not present

## 2019-10-24 DIAGNOSIS — W19XXXA Unspecified fall, initial encounter: Secondary | ICD-10-CM | POA: Diagnosis not present

## 2019-10-24 DIAGNOSIS — F325 Major depressive disorder, single episode, in full remission: Secondary | ICD-10-CM | POA: Diagnosis not present

## 2019-10-24 DIAGNOSIS — M19011 Primary osteoarthritis, right shoulder: Secondary | ICD-10-CM | POA: Diagnosis not present

## 2019-11-13 DIAGNOSIS — Z6841 Body Mass Index (BMI) 40.0 and over, adult: Secondary | ICD-10-CM | POA: Diagnosis not present

## 2019-11-13 DIAGNOSIS — M359 Systemic involvement of connective tissue, unspecified: Secondary | ICD-10-CM | POA: Diagnosis not present

## 2019-12-06 DIAGNOSIS — Z23 Encounter for immunization: Secondary | ICD-10-CM | POA: Diagnosis not present

## 2019-12-10 ENCOUNTER — Ambulatory Visit: Payer: PPO

## 2019-12-20 DIAGNOSIS — M79601 Pain in right arm: Secondary | ICD-10-CM | POA: Diagnosis not present

## 2020-01-03 ENCOUNTER — Ambulatory Visit: Payer: PPO

## 2020-01-17 DIAGNOSIS — M7581 Other shoulder lesions, right shoulder: Secondary | ICD-10-CM | POA: Diagnosis not present

## 2020-01-18 ENCOUNTER — Ambulatory Visit: Payer: PPO | Attending: Internal Medicine

## 2020-01-18 DIAGNOSIS — Z23 Encounter for immunization: Secondary | ICD-10-CM

## 2020-01-28 ENCOUNTER — Encounter: Payer: Self-pay | Admitting: Physical Therapy

## 2020-01-28 ENCOUNTER — Ambulatory Visit: Payer: PPO | Attending: Sports Medicine | Admitting: Physical Therapy

## 2020-01-28 ENCOUNTER — Other Ambulatory Visit: Payer: Self-pay

## 2020-01-28 DIAGNOSIS — R252 Cramp and spasm: Secondary | ICD-10-CM | POA: Diagnosis not present

## 2020-01-28 DIAGNOSIS — M6281 Muscle weakness (generalized): Secondary | ICD-10-CM

## 2020-01-28 DIAGNOSIS — M25511 Pain in right shoulder: Secondary | ICD-10-CM | POA: Insufficient documentation

## 2020-01-28 DIAGNOSIS — M25611 Stiffness of right shoulder, not elsewhere classified: Secondary | ICD-10-CM | POA: Diagnosis not present

## 2020-01-28 NOTE — Therapy (Signed)
[facility] [place] [facility] [address] [place], [place], [address] Phone: [phone]   Fax:  [phone]  Physical Therapy Evaluation  Patient Details  Name: Elizabeth [name] MRN: [id number] Date of Birth: 1950-03-25 Referring Provider (PT): Quenten Raven Date: 01/28/2020   PT End of Session - 01/28/20 1140    Visit Number 1    Date for PT Re-Evaluation 03/29/20    PT Start Time 0930    PT Stop Time 1014    PT Time Calculation (min) 44 min    Activity Tolerance Patient tolerated treatment well    Behavior During Therapy [facility] for tasks assessed/performed           Past Medical History:  Diagnosis Date  . Abnormal EKG   . Anemia    slight   . Arthritis   . 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 Back pain   . Depression   . Gallbladder problem   . Headache    left sided headache   . Hypertension   . Joint pain   . Obesity   . OSA (obstructive sleep apnea)    CPAP- has not used 6 months to a year since used   . Osteoarthritis   . Shortness of breath dyspnea    with exertion   . Sjogren's syndrome (HCC) 03/22/2011   Deveschwar  . Undifferentiated connective tissue disease (HCC)   . Vitamin D deficiency     Past Surgical History:  Procedure Laterality Date  . ABDOMINAL HYSTERECTOMY    . ARTERY BIOPSY Left 06/09/2015   Procedure: LEFT TEMPORAL ARTERY BIOPSY;  Surgeon: Darnell Level, MD;  Location: [facility];  Service: General;  Laterality: Left;  . CHOLECYSTECTOMY    . paratoid tumor removed      There were no vitals filed for this visit.    Subjective Assessment - 01/28/20 0921    Subjective Pt reports R shoulder pain since falling down steps in garage ~2-3 months ago and landing on R side. Pt states that she is having trouble sleeping on R side. Pt describes pain as aching pain. Pt was given exercises for rotator cuff from MD and states they have been helping some. Pt reports occasional "ache"/pain when  reaching to do hair or reaching behind to fasten bra. Pt had xray which showed degenerative changes in [facility] joint of R shoulder. Pt reports some pain in R biceps.    Pertinent History Sjorgen's, HTN    Limitations House hold activities    Diagnostic tests xrays    Patient Stated Goals be able to use arm normally    Currently in Pain? Yes    Pain Score 1     Pain Location Shoulder    Pain Orientation Right    Pain Descriptors / Indicators Aching    Pain Radiating Towards occasional pain down into R biceps    Pain Onset More than a month ago    Pain Frequency Intermittent    Aggravating Factors  lifting heavy items, overuse    Pain Relieving Factors rest, tylenol              [facility] PT Assessment - 01/28/20 0001      Assessment   Medical Diagnosis R shoulder pain    Referring Provider (PT) Ruel Favors Dominance Right    Next MD Visit 02/18/2020    Prior Therapy None      Precautions  Precautions None      Restrictions   Weight Bearing Restrictions No      Balance Screen   Has the patient fallen in the past 6 months Yes    How many times? 1    Has the patient had a decrease in activity level because of a fear of falling?  Yes   more careful going down stairs now   Is the patient reluctant to leave their home because of a fear of falling?  No      Home Environment   Additional Comments stairs to enter 3-4      Prior Function   Level of Independence Independent    Vocation Retired      Dispensing optician Intact      Posture/Postural Control   Posture/Postural Control Postural limitations    Postural Limitations Rounded Shoulders;Forward head;Increased thoracic kyphosis      ROM / Strength   AROM / PROM / Strength AROM;Strength      AROM   Overall AROM Comments B shoulder flex/abd mild limitation but equivalent B    AROM Assessment Site Shoulder    Right/Left Shoulder Right    Right Shoulder Internal Rotation --   to iliac crest pain at end  range   Right Shoulder External Rotation --   to C7 pain at end range     Strength   Strength Assessment Site Shoulder    Right/Left Shoulder Right;Left    Right Shoulder Flexion 5/5    Right Shoulder ABduction 4+/5    Right Shoulder Internal Rotation 4/5    Right Shoulder External Rotation 4/5    Left Shoulder Flexion 5/5    Left Shoulder ABduction 4+/5    Left Shoulder Internal Rotation 4+/5    Left Shoulder External Rotation 4+/5      Palpation   Palpation comment tender to palpation R biceps, triceps, periscapular area      Special Tests    Special Tests Biceps/Labral Tests;Rotator Cuff Impingement    Rotator Cuff Impingment tests Hawkins- Kennedy test;Painful Arc of Motion;Empty Can test    Biceps/Labral tests Yergason's Test      Hawkins-Kennedy test   Findings Positive    Side Right      Empty Can test   Findings Negative    Side Right      Painful Arc of Motion   Findings Negative    Side Right      Speeds test   Side Right    Comment potential positive; some pain in biceps       Yergason's Test   Findings Negative    Side Right                      Objective measurements completed on examination: See above findings.       [facility] Adult PT Treatment/Exercise - 01/28/20 0001      Exercises   Exercises Shoulder      Shoulder Exercises: Standing   External Rotation Both;10 reps;Theraband    Theraband Level (Shoulder External Rotation) Level 2 (Red)    External Rotation Limitations with scap retraction    Extension Both;10 reps;Theraband    Theraband Level (Shoulder Extension) Level 2 (Red)    Row Both;10 reps;Theraband    Theraband Level (Shoulder Row) Level 2 (Red)      Shoulder Exercises: Stretch   Internal Rotation Stretch 30 seconds    Other Shoulder Stretches bicep stretch x 30  sec                  PT Education - 01/28/20 1139    Education Details Pt educated on POC and HEP    Person(s) Educated Patient    Methods  Explanation;Demonstration;Handout    Comprehension Verbalized understanding;Returned demonstration            PT Short Term Goals - 01/28/20 1146      PT SHORT TERM GOAL #1   Title Pt will be I with initial HEP    Time 2    Period Weeks    Status New    Target Date 02/11/20             PT Long Term Goals - 01/28/20 1147      PT LONG TERM GOAL #1   Title Pt will be I with advanced HEP    Time 6    Period Weeks    Status New    Target Date 03/10/20      PT LONG TERM GOAL #2   Title Pt will report 50% reduction in R shoulder pain    Time 6    Period Weeks    Status New    Target Date 03/10/20      PT LONG TERM GOAL #3   Title Pt will demo R shoulder MMT equivalent to L    Time 6    Period Weeks    Status New    Target Date 03/10/20      PT LONG TERM GOAL #4   Title Pt will report able to sleep through night with no disruptions d/t R shoulder pain    Time 6    Period Weeks    Status New    Target Date 03/10/20                  Plan - 01/28/20 1141    Clinical Impression Statement Pt presents to clinic with R shoulder pain present for the past few months following a fall onto R side when descending steps in garage. Pt imaging shows some degenerative changes of AC joint with no acute bony abnormalities. Pt describes pain as ache and localizes to anterior shoulder and biceps. Denies radiating pain and N/T. Cervical AROM WFL with no pain. Special tests for RC and biceps tendinopathy were inconclusive. Pt does demo decreased RC strength, weakness of scap stab, ROM limitations in R IR/ER, and tenderness to musculature of R shoulder and R biceps/triceps. Pt would benefit from skilled PT to address the above impairments.    Personal Factors and Comorbidities Comorbidity 2    Comorbidities Sjorgen's, HTN    Examination-Activity Limitations Lift;Reach Overhead;Carry    Examination-Participation Restrictions Community Activity;Interpersonal Relationship     Stability/Clinical Decision Making Stable/Uncomplicated    Clinical Decision Making Low    Rehab Potential Good    PT Frequency 2x / week    PT Duration 6 weeks    PT Treatment/Interventions ADLs/Self Care Home Management;Electrical Stimulation;Iontophoresis 4mg /ml Dexamethasone;Moist Heat;Neuromuscular re-education;Therapeutic exercise;Therapeutic activities;Patient/family education;Manual techniques;Dry needling;Passive range of motion;Taping    PT Next Visit Plan scap stab, RC ex's, shoulder strength/flexibility, manual/modalities as indicated    PT Home Exercise Plan rows, shoulder ext, bicep stretch, shoulder ER (instructed to continue toher ex's from MD)    Consulted and Agree with Plan of Care Patient           Patient will benefit from skilled therapeutic intervention in order to improve the following  deficits and impairments:  Decreased range of motion, Increased muscle spasms, Impaired UE functional use, Pain, Impaired flexibility, Decreased strength, Postural dysfunction  Visit Diagnosis: Acute pain of right shoulder  Stiffness of right shoulder, not elsewhere classified  Cramp and spasm  Muscle weakness (generalized)     Problem List Patient Active Problem List   Diagnosis Date Noted  . Prediabetes 08/31/2017  . Essential hypertension 08/31/2017  . Left temporal headache 06/09/2015  . Elevated erythrocyte sedimentation rate 06/03/2015  . Serum gammaglobulin increased 06/03/2015  . Microcytic anemia 06/03/2015  . Dyspnea 06/14/2012  . Autoimmune disease (HCC) 05/19/2012  . Abnormal EKG   . Hypertension 05/27/2011   Lysle RubensAmanda , PT, DPT Maryanna ShapeAmanda M  01/28/2020, 11:52 AM  [facility] [address] [place], [place], [address] Phone: [phone]   Fax:  [phone]  Name: Hilarie FredricksonMarchia A [name] MRN: [id number] Date of Birth: 07/21/1950

## 2020-01-28 NOTE — Patient Instructions (Signed)
Access Code: [email or web address] URL: [email or web address] Date: 01/28/2020 Prepared by: Lysle Rubens  Exercises Shoulder External Rotation and Scapular Retraction with Resistance - 1 x daily - 5 x weekly - 3 sets - 10 reps Standing Shoulder Row with Anchored Resistance - 1 x daily - 5 x weekly - 3 sets - 10 reps Shoulder extension with resistance - Neutral - 1 x daily - 5 x weekly - 3 sets - 10 reps Standing Bicep Stretch at Wall - 1 x daily - 5 x weekly - 3 sets - 2 reps - 20-30 sec hold Standing Shoulder Internal Rotation Stretch with Hands Behind Back - 1 x daily - 5 x weekly - 3 sets - 2 reps - 20 sec hold Standing Shoulder Internal Rotation Stretch with Towel - 1 x daily - 5 x weekly - 3 sets - 2 reps - 20 sec hold

## 2020-01-31 ENCOUNTER — Other Ambulatory Visit: Payer: Self-pay

## 2020-01-31 ENCOUNTER — Ambulatory Visit: Payer: PPO | Admitting: Physical Therapy

## 2020-01-31 ENCOUNTER — Encounter: Payer: Self-pay | Admitting: Physical Therapy

## 2020-01-31 DIAGNOSIS — M6281 Muscle weakness (generalized): Secondary | ICD-10-CM

## 2020-01-31 DIAGNOSIS — M25611 Stiffness of right shoulder, not elsewhere classified: Secondary | ICD-10-CM

## 2020-01-31 DIAGNOSIS — M25511 Pain in right shoulder: Secondary | ICD-10-CM

## 2020-01-31 NOTE — Therapy (Signed)
[facility] [place] [facility] [address] [place], [place], [address] Phone: [phone]   Fax:  [phone]  Physical Therapy Treatment  Patient Details  Name: Elizabeth [name] MRN: [id number] Date of Birth: 1950-12-28 Referring Provider (PT): Quenten Raven Date: 01/31/2020   PT End of Session - 01/31/20 1004    Visit Number 2    Date for PT Re-Evaluation 03/29/20    PT Start Time 0925    PT Stop Time 1007    PT Time Calculation (min) 42 min    Activity Tolerance Patient tolerated treatment well    Behavior During Therapy [facility] for tasks assessed/performed           Past Medical History:  Diagnosis Date  . Abnormal EKG   . Anemia    slight   . Arthritis   . 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 Back pain   . Depression   . Gallbladder problem   . Headache    left sided headache   . Hypertension   . Joint pain   . Obesity   . OSA (obstructive sleep apnea)    CPAP- has not used 6 months to a year since used   . Osteoarthritis   . Shortness of breath dyspnea    with exertion   . Sjogren's syndrome (HCC) 03/22/2011   Deveschwar  . Undifferentiated connective tissue disease (HCC)   . Vitamin D deficiency     Past Surgical History:  Procedure Laterality Date  . ABDOMINAL HYSTERECTOMY    . ARTERY BIOPSY Left 06/09/2015   Procedure: LEFT TEMPORAL ARTERY BIOPSY;  Surgeon: Darnell Level, MD;  Location: [facility];  Service: General;  Laterality: Left;  . CHOLECYSTECTOMY    . paratoid tumor removed      There were no vitals filed for this visit.   Subjective Assessment - 01/31/20 0924    Subjective No pain today in shoulder. Said HEP is going well.    Currently in Pain? No/denies    Pain Score 0-No pain                             [facility] Adult PT Treatment/Exercise - 01/31/20 0001      Shoulder Exercises: Supine   Protraction 20 reps;Weights;Right   serratus punch   Other Supine  Exercises Chest press (2lb wt bar) 2x10, Bar flex 2x10 #2      Shoulder Exercises: Seated   Other Seated Exercises Nustep L2      Shoulder Exercises: Standing   External Rotation Right;20 reps;Theraband    Theraband Level (Shoulder External Rotation) Level 2 (Red)    Internal Rotation Right;20 reps;Theraband    Theraband Level (Shoulder Internal Rotation) Level 2 (Red)    Extension Both;20 reps;Theraband    Theraband Level (Shoulder Extension) Level 2 (Red)    Row 20 reps;Theraband;Both   Red   Theraband Level (Shoulder Row) Level 2 (Red)    Retraction 20 reps;Both;Theraband   20x3"   Theraband Level (Shoulder Retraction) Level 2 (Red)    Other Standing Exercises Ball on wall (red mini ball) 3x20"    flex/abd     Shoulder Exercises: Stretch   Internal Rotation Stretch 30 seconds    Internal Rotation Stretch Limitations 2x30 sec w/strap                    PT Short  Term Goals - 01/28/20 1146      PT SHORT TERM GOAL #1   Title Pt will be I with initial HEP    Time 2    Period Weeks    Status New    Target Date 02/11/20             PT Long Term Goals - 01/28/20 1147      PT LONG TERM GOAL #1   Title Pt will be I with advanced HEP    Time 6    Period Weeks    Status New    Target Date 03/10/20      PT LONG TERM GOAL #2   Title Pt will report 50% reduction in R shoulder pain    Time 6    Period Weeks    Status New    Target Date 03/10/20      PT LONG TERM GOAL #3   Title Pt will demo R shoulder MMT equivalent to L    Time 6    Period Weeks    Status New    Target Date 03/10/20      PT LONG TERM GOAL #4   Title Pt will report able to sleep through night with no disruptions d/t R shoulder pain    Time 6    Period Weeks    Status New    Target Date 03/10/20                 Plan - 01/31/20 1005    Clinical Impression Statement Pt. has been compliant with HEP and reports that stretches and exercises from the PT really seem to be  helping. Today we increased repetitions and added more shoulder stability exercises. Pt. experienced slight discomfort in her biceps area during abduction strengthening exercises and IR stretching but no c/o pain. Educated patient about the benefits of using ice/heat if needed.    PT Treatment/Interventions ADLs/Self Care Home Management;Electrical Stimulation;Iontophoresis 4mg /ml Dexamethasone;Moist Heat;Neuromuscular re-education;Therapeutic exercise;Therapeutic activities;Patient/family education;Manual techniques;Dry needling;Passive range of motion;Taping    PT Next Visit Plan scap stab, RC ex's, shoulder strength/flexibility, manual/modalities as indicated           Patient will benefit from skilled therapeutic intervention in order to improve the following deficits and impairments:  Decreased range of motion, Increased muscle spasms, Impaired UE functional use, Pain, Impaired flexibility, Decreased strength, Postural dysfunction  Visit Diagnosis: Acute pain of right shoulder  Stiffness of right shoulder, not elsewhere classified  Muscle weakness (generalized)     Problem List Patient Active Problem List   Diagnosis Date Noted  . Prediabetes 08/31/2017  . Essential hypertension 08/31/2017  . Left temporal headache 06/09/2015  . Elevated erythrocyte sedimentation rate 06/03/2015  . Serum gammaglobulin increased 06/03/2015  . Microcytic anemia 06/03/2015  . Dyspnea 06/14/2012  . Autoimmune disease (HCC) 05/19/2012  . Abnormal EKG   . Hypertension 05/27/2011    07/27/2011, SPTA 01/31/2020, 10:40 AM  [facility] [address] [place], [place], [place] Phone: [phone]   Fax:  [phone]  Name: Elizabeth [name] MRN: [name] Date of Birth: [birth year]

## 2020-02-04 ENCOUNTER — Ambulatory Visit: Payer: PPO | Admitting: Physical Therapy

## 2020-02-04 ENCOUNTER — Encounter: Payer: Self-pay | Admitting: Physical Therapy

## 2020-02-04 ENCOUNTER — Other Ambulatory Visit: Payer: Self-pay

## 2020-02-04 DIAGNOSIS — M25511 Pain in right shoulder: Secondary | ICD-10-CM | POA: Diagnosis not present

## 2020-02-04 DIAGNOSIS — M6281 Muscle weakness (generalized): Secondary | ICD-10-CM

## 2020-02-04 DIAGNOSIS — M25611 Stiffness of right shoulder, not elsewhere classified: Secondary | ICD-10-CM

## 2020-02-04 NOTE — Therapy (Cosign Needed)
[facility] [place] [facility] [address] [place], [place], [address] Phone: [phone]   Fax:  [phone]  Physical Therapy Treatment  Patient Details  Name: Elizabeth [name] MRN: [id number] Date of Birth: [birth year] Referring Provider (PT): Elizabeth [name] Date: 02/04/2020   PT End of Session - 02/04/20 1014    Visit Number 3    Date for PT Re-Evaluation 03/29/20    PT Start Time 0930    PT Stop Time 1014    PT Time Calculation (min) 44 min    Activity Tolerance Patient tolerated treatment well    Behavior During Therapy [facility] for tasks assessed/performed           Past Medical History:  Diagnosis Date  . Abnormal EKG   . Anemia    slight   . Arthritis   . 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 Back pain   . Depression   . Gallbladder problem   . Headache    left sided headache   . Hypertension   . Joint pain   . Obesity   . OSA (obstructive sleep apnea)    CPAP- has not used 6 months to a year since used   . Osteoarthritis   . Shortness of breath dyspnea    with exertion   . Sjogren's syndrome (HCC) 03/22/2011   Deveschwar  . Undifferentiated connective tissue disease (HCC)   . Vitamin D deficiency     Past Surgical History:  Procedure Laterality Date  . ABDOMINAL HYSTERECTOMY    . ARTERY BIOPSY Left 06/09/2015   Procedure: LEFT TEMPORAL ARTERY BIOPSY;  Surgeon: Darnell Level, MD;  Location: [facility];  Service: General;  Laterality: Left;  . CHOLECYSTECTOMY    . paratoid tumor removed      There were no vitals filed for this visit.   Subjective Assessment - 02/04/20 0930    Subjective No pain in the shoulder today. Said she started doing her elliptical at home to loosen up/warm up her shoulder before performing her HEP.    Currently in Pain? No/denies                             [facility] Adult PT Treatment/Exercise - 02/04/20 0001      Exercises   Exercises Shoulder       Shoulder Exercises: Seated   Other Seated Exercises UBE L1.5      Shoulder Exercises: Standing   External Rotation Right;20 reps;Theraband    Theraband Level (Shoulder External Rotation) Level 2 (Red)    Internal Rotation 20 reps    Theraband Level (Shoulder Internal Rotation) Level 2 (Red)    Extension Both;20 reps;Theraband   3" hold   Theraband Level (Shoulder Extension) Level 2 (Red)    Row 20 reps;Theraband;Both   3" hold   Theraband Level (Shoulder Row) Level 2 (Red)    Retraction 20 reps;Both   3" hold    Theraband Level (Shoulder Retraction) Level 2 (Red)    Other Standing Exercises Walk-walk (yellow TB) x6, ball-on-wall flex/abd. 3x20"    Other Standing Exercises 1lb bar AAROM flex/scap/ext/IR x10 ea      Shoulder Exercises: Stretch   Corner Stretch 5 reps;10 seconds    Corner Stretch Limitations Righ only     Internal Rotation Stretch 5 reps    Internal Rotation Stretch Limitations 10 second holds  PT Short Term Goals - 01/28/20 1146      PT SHORT TERM GOAL #1   Title Pt will be I with initial HEP    Time 2    Period Weeks    Status New    Target Date 02/11/20             PT Long Term Goals - 01/28/20 1147      PT LONG TERM GOAL #1   Title Pt will be I with advanced HEP    Time 6    Period Weeks    Status New    Target Date 03/10/20      PT LONG TERM GOAL #2   Title Pt will report 50% reduction in R shoulder pain    Time 6    Period Weeks    Status New    Target Date 03/10/20      PT LONG TERM GOAL #3   Title Pt will demo R shoulder MMT equivalent to L    Time 6    Period Weeks    Status New    Target Date 03/10/20      PT LONG TERM GOAL #4   Title Pt will report able to sleep through night with no disruptions d/t R shoulder pain    Time 6    Period Weeks    Status New    Target Date 03/10/20                 Plan - 02/04/20 1015    Clinical Impression Statement Patient continues to be compliant  wth HEP at home and has no issues with any exercises. Decided to evaluate her need for continued PT on thursday when we see her. She does have weakness in ER motions and is experiencing some pain located right in the bicipital grove that is not specific to any activity or motion.    PT Treatment/Interventions ADLs/Self Care Home Management;Electrical Stimulation;Iontophoresis 4mg /ml Dexamethasone;Moist Heat;Neuromuscular re-education;Therapeutic exercise;Therapeutic activities;Patient/family education;Manual techniques;Dry needling;Passive range of motion;Taping    PT Next Visit Plan scap stab, RC ex's, shoulder strength/flexibility. Talk about possible D/C           Patient will benefit from skilled therapeutic intervention in order to improve the following deficits and impairments:  Decreased range of motion, Increased muscle spasms, Impaired UE functional use, Pain, Impaired flexibility, Decreased strength, Postural dysfunction  Visit Diagnosis: Acute pain of right shoulder  Stiffness of right shoulder, not elsewhere classified  Muscle weakness (generalized)     Problem List Patient Active Problem List   Diagnosis Date Noted  . Prediabetes 08/31/2017  . Essential hypertension 08/31/2017  . Left temporal headache 06/09/2015  . Elevated erythrocyte sedimentation rate 06/03/2015  . Serum gammaglobulin increased 06/03/2015  . Microcytic anemia 06/03/2015  . Dyspnea 06/14/2012  . Autoimmune disease (HCC) 05/19/2012  . Abnormal EKG   . Hypertension 05/27/2011    07/27/2011, SPTA 02/04/2020, 10:17 AM  [facility] [place] [facility] [address] [place], [place], [place] Phone: [phone]   Fax:  [phone]  Name: Elizabeth [name] MRN: [name] Date of Birth: 1951/01/20

## 2020-02-06 ENCOUNTER — Other Ambulatory Visit: Payer: Self-pay

## 2020-02-06 ENCOUNTER — Encounter: Payer: Self-pay | Admitting: Physical Therapy

## 2020-02-06 ENCOUNTER — Ambulatory Visit: Payer: PPO | Admitting: Physical Therapy

## 2020-02-06 DIAGNOSIS — M25511 Pain in right shoulder: Secondary | ICD-10-CM | POA: Diagnosis not present

## 2020-02-06 DIAGNOSIS — M25611 Stiffness of right shoulder, not elsewhere classified: Secondary | ICD-10-CM

## 2020-02-06 DIAGNOSIS — M6281 Muscle weakness (generalized): Secondary | ICD-10-CM

## 2020-02-06 NOTE — Therapy (Signed)
[facility] [place], [place], [address] Phone: [phone]   Fax:  [phone]  Physical Therapy Treatment  Patient Details  Name: Elizabeth [name] MRN: [id number] Date of Birth: 1950-05-25 Referring Provider (PT): Sherryle Lis Date: 02/06/2020   PT End of Session - 02/06/20 1054    Visit Number 4    Date for PT Re-Evaluation 03/29/20    PT Start Time 1016    PT Stop Time 1100    PT Time Calculation (min) 44 min    Activity Tolerance Patient tolerated treatment well    Behavior During Therapy [place] for tasks assessed/performed           Past Medical History:  Diagnosis Date  . Abnormal EKG   . Anemia    slight   . Arthritis   . 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 Back pain   . Depression   . Gallbladder problem   . Headache    left sided headache   . Hypertension   . Joint pain   . Obesity   . OSA (obstructive sleep apnea)    CPAP- has not used 6 months to a year since used   . Osteoarthritis   . Shortness of breath dyspnea    with exertion   . Sjogren's syndrome ([place]) 03/22/2011   [place]  . Undifferentiated connective tissue disease ([place])   . Vitamin D deficiency     Past Surgical History:  Procedure Laterality Date  . ABDOMINAL HYSTERECTOMY    . ARTERY BIOPSY Left 06/09/2015   Procedure: LEFT TEMPORAL ARTERY BIOPSY;  Surgeon: Armandina Gemma, MD;  Location: [facility];  Service: General;  Laterality: Left;  . CHOLECYSTECTOMY    . paratoid tumor removed      There were no vitals filed for this visit.   Subjective Assessment - 02/06/20 1021    Subjective Feels some pain in her biceps area. Overall doing well though    Currently in Pain? Yes                             [facility] Adult PT Treatment/Exercise - 02/06/20 0001      Shoulder Exercises: Supine   Protraction 15 reps    Protraction Weight (lbs) #2     Protraction Limitations serratus  punch    Flexion Both;Weights;15 reps    Shoulder Flexion Weight (lbs) #3 wt bar     Other Supine Exercises Chest press #3 2x10      Shoulder Exercises: Seated   Other Seated Exercises UBE L1 3 mins ea way, Nustep L2 58mns       Shoulder Exercises: Standing   Protraction Both    Theraband Level (Shoulder Protraction) Level 2 (Red)    Protraction Limitations x15    External Rotation Right;20 reps;Theraband    Theraband Level (Shoulder External Rotation) Level 2 (Red)    Internal Rotation 20 reps    Theraband Level (Shoulder Internal Rotation) Level 2 (Red)    Extension Both;20 reps;Theraband   3" hold   Theraband Level (Shoulder Extension) Level 2 (Red)    Row 20 reps;Theraband;Both   3"hold    Theraband Level (Shoulder Row) Level 2 (Red)    Retraction 20 reps;Both;Theraband    Theraband Level (Shoulder Retraction) Level 2 (Red)  PT Short Term Goals - 01/28/20 1146      PT SHORT TERM GOAL #1   Title Pt will be I with initial HEP    Time 2    Period Weeks    Status New    Target Date 02/11/20             PT Long Term Goals - 02/06/20 1022      PT LONG TERM GOAL #1   Title Pt will be I with advanced HEP    Status Achieved      PT LONG TERM GOAL #2   Title Pt will report 50% reduction in R shoulder pain    Status Achieved      PT LONG TERM GOAL #3   Title Pt will demo R shoulder MMT equivalent to L    Status Partially Met      PT LONG TERM GOAL #4   Title Pt will report able to sleep through night with no disruptions d/t R shoulder pain    Status Achieved                 Plan - 02/06/20 1054    Clinical Impression Statement Patient is doing really well. She has met all of her goals. She feels confident in all of her home exercises and plans to start going to back to the gym or exercising more at her home gym. See's the Dr. on the 30th for a follow up.    PT Treatment/Interventions ADLs/Self Care Home Management;Electrical  Stimulation;Iontophoresis 51m/ml Dexamethasone;Moist Heat;Neuromuscular re-education;Therapeutic exercise;Therapeutic activities;Patient/family education;Manual techniques;Dry needling;Passive range of motion;Taping    PT Next Visit Plan Place patient on 2 week hold till she sees the Dr.           Patient will benefit from skilled therapeutic intervention in order to improve the following deficits and impairments:  Decreased range of motion, Increased muscle spasms, Impaired UE functional use, Pain, Impaired flexibility, Decreased strength, Postural dysfunction  Visit Diagnosis: Acute pain of right shoulder  Stiffness of right shoulder, not elsewhere classified  Muscle weakness (generalized)     Problem List Patient Active Problem List   Diagnosis Date Noted  . Prediabetes 08/31/2017  . Essential hypertension 08/31/2017  . Left temporal headache 06/09/2015  . Elevated erythrocyte sedimentation rate 06/03/2015  . Serum gammaglobulin increased 06/03/2015  . Microcytic anemia 06/03/2015  . Dyspnea 06/14/2012  . Autoimmune disease ([facility] 05/19/2012  . Abnormal EKG   . Hypertension 05/27/2011    KLavenia Atlas SPTA 02/06/2020, 11:04 AM  [place] [address]Phone: [phone]  Fax:  [phone] Name: Elizabeth BANGURAMRN: [id number] of Birth: 9August 03, 1952

## 2020-02-18 DIAGNOSIS — M7581 Other shoulder lesions, right shoulder: Secondary | ICD-10-CM | POA: Diagnosis not present

## 2020-03-03 DIAGNOSIS — Z79899 Other long term (current) drug therapy: Secondary | ICD-10-CM | POA: Diagnosis not present

## 2020-03-03 DIAGNOSIS — M3500 Sicca syndrome, unspecified: Secondary | ICD-10-CM | POA: Diagnosis not present

## 2020-03-03 DIAGNOSIS — H40013 Open angle with borderline findings, low risk, bilateral: Secondary | ICD-10-CM | POA: Diagnosis not present

## 2020-05-13 DIAGNOSIS — Z6841 Body Mass Index (BMI) 40.0 and over, adult: Secondary | ICD-10-CM | POA: Diagnosis not present

## 2020-05-13 DIAGNOSIS — M359 Systemic involvement of connective tissue, unspecified: Secondary | ICD-10-CM | POA: Diagnosis not present

## 2020-06-01 DIAGNOSIS — H40013 Open angle with borderline findings, low risk, bilateral: Secondary | ICD-10-CM | POA: Diagnosis not present

## 2020-06-01 DIAGNOSIS — H04123 Dry eye syndrome of bilateral lacrimal glands: Secondary | ICD-10-CM | POA: Diagnosis not present

## 2020-07-21 ENCOUNTER — Ambulatory Visit: Payer: PPO | Attending: Internal Medicine

## 2020-07-21 DIAGNOSIS — Z23 Encounter for immunization: Secondary | ICD-10-CM

## 2020-07-21 NOTE — Progress Notes (Signed)
   Covid-19 Vaccination Clinic  Name:  ZIERRA LAROQUE    MRN: [id number] DOB: [birth year]  07/21/2020  Ms. Harrington was observed post Covid-19 immunization for 15 minutes without incident. She was provided with Vaccine Information Sheet and instruction to access the V-Safe system.   Ms. Nath was instructed to call 911 with any severe reactions post vaccine: Marland Kitchen Difficulty breathing  . Swelling of face and throat  . A fast heartbeat  . A bad rash all over body  . Dizziness and weakness   Immunizations Administered    Name Date Dose VIS Date Route   PFIZER Comrnaty(Gray TOP) Covid-19 Vaccine 07/21/2020 10:50 AM 0.3 mL 02/27/2020 Intramuscular   Manufacturer: [employer], [employer]   Lot: [id number]   NDC: [phone]

## 2020-07-28 ENCOUNTER — Other Ambulatory Visit (HOSPITAL_BASED_OUTPATIENT_CLINIC_OR_DEPARTMENT_OTHER): Payer: Self-pay

## 2020-07-28 MED ORDER — PFIZER-BIONT COVID-19 VAC-TRIS 30 MCG/0.3ML IM SUSP
INTRAMUSCULAR | 0 refills | Status: DC
  Filled 2020-07-28: qty 0.3, 1d supply, fill #0

## 2020-08-27 DIAGNOSIS — H16223 Keratoconjunctivitis sicca, not specified as Sjogren's, bilateral: Secondary | ICD-10-CM | POA: Diagnosis not present

## 2020-08-27 DIAGNOSIS — H524 Presbyopia: Secondary | ICD-10-CM | POA: Diagnosis not present

## 2020-08-27 DIAGNOSIS — M3500 Sicca syndrome, unspecified: Secondary | ICD-10-CM | POA: Diagnosis not present

## 2020-08-27 DIAGNOSIS — Z79899 Other long term (current) drug therapy: Secondary | ICD-10-CM | POA: Diagnosis not present

## 2020-08-27 DIAGNOSIS — H04123 Dry eye syndrome of bilateral lacrimal glands: Secondary | ICD-10-CM | POA: Diagnosis not present

## 2020-08-27 DIAGNOSIS — H40013 Open angle with borderline findings, low risk, bilateral: Secondary | ICD-10-CM | POA: Diagnosis not present

## 2020-09-02 DIAGNOSIS — Z1231 Encounter for screening mammogram for malignant neoplasm of breast: Secondary | ICD-10-CM | POA: Diagnosis not present

## 2020-10-23 DIAGNOSIS — Z1159 Encounter for screening for other viral diseases: Secondary | ICD-10-CM | POA: Diagnosis not present

## 2020-10-23 DIAGNOSIS — Z0000 Encounter for general adult medical examination without abnormal findings: Secondary | ICD-10-CM | POA: Diagnosis not present

## 2020-10-23 DIAGNOSIS — I10 Essential (primary) hypertension: Secondary | ICD-10-CM | POA: Diagnosis not present

## 2020-10-23 DIAGNOSIS — Z23 Encounter for immunization: Secondary | ICD-10-CM | POA: Diagnosis not present

## 2020-10-23 DIAGNOSIS — M359 Systemic involvement of connective tissue, unspecified: Secondary | ICD-10-CM | POA: Diagnosis not present

## 2020-11-02 DIAGNOSIS — H16223 Keratoconjunctivitis sicca, not specified as Sjogren's, bilateral: Secondary | ICD-10-CM | POA: Diagnosis not present

## 2020-11-02 DIAGNOSIS — H04123 Dry eye syndrome of bilateral lacrimal glands: Secondary | ICD-10-CM | POA: Diagnosis not present

## 2020-11-02 DIAGNOSIS — H40013 Open angle with borderline findings, low risk, bilateral: Secondary | ICD-10-CM | POA: Diagnosis not present

## 2020-11-02 DIAGNOSIS — Z79899 Other long term (current) drug therapy: Secondary | ICD-10-CM | POA: Diagnosis not present

## 2020-11-17 DIAGNOSIS — Z6838 Body mass index (BMI) 38.0-38.9, adult: Secondary | ICD-10-CM | POA: Diagnosis not present

## 2020-11-17 DIAGNOSIS — M359 Systemic involvement of connective tissue, unspecified: Secondary | ICD-10-CM | POA: Diagnosis not present

## 2020-11-17 DIAGNOSIS — E669 Obesity, unspecified: Secondary | ICD-10-CM | POA: Diagnosis not present

## 2020-12-08 ENCOUNTER — Ambulatory Visit: Payer: PPO | Attending: Internal Medicine

## 2020-12-08 DIAGNOSIS — Z23 Encounter for immunization: Secondary | ICD-10-CM

## 2020-12-08 NOTE — Progress Notes (Signed)
   Covid-19 Vaccination Clinic  Name:  Elizabeth [name]    MRN: [id number] DOB: 12-Mar-1951  12/08/2020  [name] was observed post Covid-19 immunization for 15 minutes without incident. She was provided with Vaccine Information Sheet and instruction to access the V-Safe system.   [name] was instructed to call 911 with any severe reactions post vaccine: Difficulty breathing  Swelling of face and throat  A fast heartbeat  A bad rash all over body  Dizziness and weakness

## 2020-12-14 ENCOUNTER — Other Ambulatory Visit (HOSPITAL_BASED_OUTPATIENT_CLINIC_OR_DEPARTMENT_OTHER): Payer: Self-pay

## 2020-12-14 DIAGNOSIS — H40013 Open angle with borderline findings, low risk, bilateral: Secondary | ICD-10-CM | POA: Diagnosis not present

## 2020-12-14 DIAGNOSIS — H04123 Dry eye syndrome of bilateral lacrimal glands: Secondary | ICD-10-CM | POA: Diagnosis not present

## 2020-12-14 DIAGNOSIS — H16223 Keratoconjunctivitis sicca, not specified as Sjogren's, bilateral: Secondary | ICD-10-CM | POA: Diagnosis not present

## 2020-12-14 MED ORDER — COVID-19MRNA BIVAL VACC PFIZER 30 MCG/0.3ML IM SUSP
INTRAMUSCULAR | 0 refills | Status: DC
  Filled 2020-12-14: qty 0.3, 1d supply, fill #0

## 2020-12-24 DIAGNOSIS — Z23 Encounter for immunization: Secondary | ICD-10-CM | POA: Diagnosis not present

## 2021-04-06 DIAGNOSIS — H04123 Dry eye syndrome of bilateral lacrimal glands: Secondary | ICD-10-CM | POA: Diagnosis not present

## 2021-04-06 DIAGNOSIS — Z79899 Other long term (current) drug therapy: Secondary | ICD-10-CM | POA: Diagnosis not present

## 2021-04-06 DIAGNOSIS — H5203 Hypermetropia, bilateral: Secondary | ICD-10-CM | POA: Diagnosis not present

## 2021-05-18 DIAGNOSIS — M359 Systemic involvement of connective tissue, unspecified: Secondary | ICD-10-CM | POA: Diagnosis not present

## 2021-05-18 DIAGNOSIS — E669 Obesity, unspecified: Secondary | ICD-10-CM | POA: Diagnosis not present

## 2021-05-18 DIAGNOSIS — Z6833 Body mass index (BMI) 33.0-33.9, adult: Secondary | ICD-10-CM | POA: Diagnosis not present

## 2021-08-20 DIAGNOSIS — Z6834 Body mass index (BMI) 34.0-34.9, adult: Secondary | ICD-10-CM | POA: Diagnosis not present

## 2021-08-20 DIAGNOSIS — E669 Obesity, unspecified: Secondary | ICD-10-CM | POA: Diagnosis not present

## 2021-08-20 DIAGNOSIS — M359 Systemic involvement of connective tissue, unspecified: Secondary | ICD-10-CM | POA: Diagnosis not present

## 2021-08-20 DIAGNOSIS — I10 Essential (primary) hypertension: Secondary | ICD-10-CM | POA: Diagnosis not present

## 2021-08-31 DIAGNOSIS — I10 Essential (primary) hypertension: Secondary | ICD-10-CM | POA: Diagnosis not present

## 2021-08-31 DIAGNOSIS — R61 Generalized hyperhidrosis: Secondary | ICD-10-CM | POA: Diagnosis not present

## 2021-08-31 DIAGNOSIS — R0789 Other chest pain: Secondary | ICD-10-CM | POA: Diagnosis not present

## 2021-09-06 DIAGNOSIS — Z1231 Encounter for screening mammogram for malignant neoplasm of breast: Secondary | ICD-10-CM | POA: Diagnosis not present

## 2021-09-10 ENCOUNTER — Encounter: Payer: Self-pay | Admitting: Cardiology

## 2021-09-10 ENCOUNTER — Ambulatory Visit: Payer: PPO | Admitting: Cardiology

## 2021-09-10 VITALS — BP 168/60 | HR 50 | Temp 97.5°F | Resp 17 | Ht 63.0 in | Wt 203.6 lb

## 2021-09-10 DIAGNOSIS — I10 Essential (primary) hypertension: Secondary | ICD-10-CM | POA: Diagnosis not present

## 2021-09-10 DIAGNOSIS — R0789 Other chest pain: Secondary | ICD-10-CM | POA: Diagnosis not present

## 2021-09-10 MED ORDER — TELMISARTAN-HCTZ 80-25 MG PO TABS: 1.0000 | ORAL_TABLET | Freq: Every day | ORAL | 3 refills | Status: DC

## 2021-09-10 MED ORDER — AMLODIPINE BESYLATE 5 MG PO TABS: 5.0000 mg | ORAL_TABLET | Freq: Every day | ORAL | 3 refills | Status: DC

## 2021-09-13 ENCOUNTER — Encounter: Payer: Self-pay | Admitting: Cardiology

## 2021-09-16 ENCOUNTER — Other Ambulatory Visit (HOSPITAL_COMMUNITY): Payer: Self-pay | Admitting: Cardiology

## 2021-10-01 LAB — BASIC METABOLIC PANEL
BUN/Creatinine Ratio: 17 (ref 12–28)
BUN: 14 mg/dL (ref 8–27)
CO2: 27 mmol/L (ref 20–29)
Calcium: 8.9 mg/dL (ref 8.7–10.3)
Chloride: 102 mmol/L (ref 96–106)
Creatinine, Ser: 0.84 mg/dL (ref 0.57–1.00)
Glucose: 98 mg/dL (ref 70–99)
Potassium: 4.7 mmol/L (ref 3.5–5.2)
Sodium: 142 mmol/L (ref 134–144)
eGFR: 75 mL/min/{1.73_m2} (ref >59)

## 2021-10-01 LAB — ALDOSTERONE + RENIN ACTIVITY W/ RATIO
ALDOS/RENIN RATIO: 0.3 (ref 0.0–30.0)
ALDOSTERONE: 5 ng/dL (ref 0.0–30.0)
Renin: 16.252 ng/mL/hr — ABNORMAL HIGH (ref 0.167–5.380)

## 2021-10-07 ENCOUNTER — Ambulatory Visit: Payer: PPO

## 2021-10-07 DIAGNOSIS — I10 Essential (primary) hypertension: Secondary | ICD-10-CM | POA: Diagnosis not present

## 2021-10-20 NOTE — Progress Notes (Unsigned)
Patient referred by Harlan Stains, MD for hypertension, chest heaviness  Subjective:   Elizabeth [name], female    DOB: [birth year], 71 y.o.   MRN: [id number]   No chief complaint on file.   HPI  71 y.o. African-American female with hypertension, undifferentiated connective tissue disease, referred for evaluation of chest pain  ***  Initial consultation visit 08/2021: Patient is retired from [place] [employer]. She stays active with regular exercise-including aerobic exercise and weight training. She had been treated with plaquenil for undifferentiated connective tissue disease. She eventually came off this medication without any recurrence of joint pain. She had come off antihypertensive medications with weight loss. However, in the last few months, she has had significant increase in blood pressure. Concurrently, she had had episode of chest heaviness lasting for several min. She does not have any chest pain during her physical exertion.   Patient previously underwent echocardiogram and stress testing in 2013, that showed structurally normal heart and anterior and inferior artifact due to obesity without evidence of ischemia.    Current Outpatient Medications:    amLODipine (NORVASC) 5 MG tablet, Take 1 tablet (5 mg total) by mouth daily., Disp: 30 tablet, Rfl: 3   telmisartan-hydrochlorothiazide (MICARDIS HCT) 80-25 MG tablet, Take 1 tablet by mouth daily., Disp: 30 tablet, Rfl: 3   Turmeric 400 MG CAPS, Take 1 capsule by mouth daily., Disp: , Rfl:    Cardiovascular and other pertinent studies:  Reviewed external labs and tests, independently interpreted  Renal artery duplex  10/07/2021:  No evidence of renal artery occlusive disease in either renal artery.  Normal resistivity index.  Renal length is within normal limits for both kidneys.  Normal abdominal aorta flow velocities noted.   Echocardiogram 10/07/2021:  Normal LV systolic function with visual EF 60-65%. Left ventricle  cavity  is normal in size. Normal left ventricular wall thickness. Normal global  wall motion. Normal diastolic filling pattern, normal LAP.  Mild (Grade I) mitral regurgitation. Mild calcification of the mitral  valve annulus.  No prior study for comparison.   EKG 09/10/2021: Sinus bradycardia 50 bpm  Possible old anteroseptal infarct   Recent labs: 08/20/2021: Glucose 90, BUN/Cr 12/0.78. EGFR 82. Na/K 142/4.5. Rest of the CMP normal H/H 12/37. MCV 82. Platelets 177 TSH 1.7 normal    Review of Systems  Cardiovascular:  Positive for chest pain. Negative for dyspnea on exertion, leg swelling, palpitations and syncope.         There were no vitals filed for this visit.    There is no height or weight on file to calculate BMI. There were no vitals filed for this visit.   Objective:   Physical Exam Vitals and nursing note reviewed.  Constitutional:      General: She is not in acute distress. Neck:     Vascular: No JVD.  Cardiovascular:     Rate and Rhythm: Normal rate and regular rhythm.     Heart sounds: Normal heart sounds. No murmur heard. Pulmonary:     Effort: Pulmonary effort is normal.     Breath sounds: Normal breath sounds. No wheezing or rales.  Musculoskeletal:     Right lower leg: No edema.     Left lower leg: No edema.         Visit diagnoses: No diagnosis found.    No orders of the defined types were placed in this encounter.    No orders of the defined types were placed in this encounter.  Assessment & Recommendations:    71 y.o. African-American female with hypertension, undifferentiated connective tissue disease, referred for evaluation of chest pain  *** Chest heaviness: Not exertional. I reckon this could be related to her hypertension If not improved after blood pressure control, will consider ischemia testing.  *** Hypertension: No RAS on renal artery duplex. Check echocardiogram, renal artery duplex,  renin/aldosterone. Change HCTZ 25 mg to telmisartan-HCTZ 80-25 mg.  Repeat BMP in 1 week  Further recommendations after above testing  Thank you for referring the patient to [place]. Please feel free to contact with any questions.   Nigel Mormon, MD Pager: [phone] Office: [phone]

## 2021-10-21 ENCOUNTER — Encounter: Payer: Self-pay | Admitting: Cardiology

## 2021-10-21 ENCOUNTER — Ambulatory Visit: Payer: PPO | Admitting: Cardiology

## 2021-10-21 VITALS — BP 152/64 | HR 56 | Temp 98.2°F | Resp 16 | Ht 63.0 in | Wt 207.2 lb

## 2021-10-21 DIAGNOSIS — I10 Essential (primary) hypertension: Secondary | ICD-10-CM | POA: Diagnosis not present

## 2021-10-21 DIAGNOSIS — R0789 Other chest pain: Secondary | ICD-10-CM | POA: Diagnosis not present

## 2021-10-21 MED ORDER — AMLODIPINE BESYLATE 5 MG PO TABS: 5.0000 mg | ORAL_TABLET | Freq: Every day | ORAL | 3 refills | Status: DC

## 2021-10-21 MED ORDER — TELMISARTAN-HCTZ 80-25 MG PO TABS: 1.0000 | ORAL_TABLET | Freq: Every day | ORAL | 3 refills | Status: AC

## 2021-10-25 ENCOUNTER — Ambulatory Visit: Payer: PPO

## 2021-10-25 DIAGNOSIS — R0789 Other chest pain: Secondary | ICD-10-CM | POA: Diagnosis not present

## 2021-11-04 DIAGNOSIS — Z0000 Encounter for general adult medical examination without abnormal findings: Secondary | ICD-10-CM | POA: Diagnosis not present

## 2021-11-04 DIAGNOSIS — E669 Obesity, unspecified: Secondary | ICD-10-CM | POA: Diagnosis not present

## 2021-11-04 DIAGNOSIS — M2142 Flat foot [pes planus] (acquired), left foot: Secondary | ICD-10-CM | POA: Diagnosis not present

## 2021-11-04 DIAGNOSIS — M359 Systemic involvement of connective tissue, unspecified: Secondary | ICD-10-CM | POA: Diagnosis not present

## 2021-11-04 DIAGNOSIS — M8588 Other specified disorders of bone density and structure, other site: Secondary | ICD-10-CM | POA: Diagnosis not present

## 2021-11-04 DIAGNOSIS — F325 Major depressive disorder, single episode, in full remission: Secondary | ICD-10-CM | POA: Diagnosis not present

## 2021-11-04 DIAGNOSIS — R109 Unspecified abdominal pain: Secondary | ICD-10-CM | POA: Diagnosis not present

## 2021-11-04 DIAGNOSIS — G4733 Obstructive sleep apnea (adult) (pediatric): Secondary | ICD-10-CM | POA: Diagnosis not present

## 2021-11-04 DIAGNOSIS — Z136 Encounter for screening for cardiovascular disorders: Secondary | ICD-10-CM | POA: Diagnosis not present

## 2021-11-04 DIAGNOSIS — M2141 Flat foot [pes planus] (acquired), right foot: Secondary | ICD-10-CM | POA: Diagnosis not present

## 2021-11-04 DIAGNOSIS — I10 Essential (primary) hypertension: Secondary | ICD-10-CM | POA: Diagnosis not present

## 2021-11-04 DIAGNOSIS — L680 Hirsutism: Secondary | ICD-10-CM | POA: Diagnosis not present

## 2021-11-04 DIAGNOSIS — Z23 Encounter for immunization: Secondary | ICD-10-CM | POA: Diagnosis not present

## 2022-01-04 ENCOUNTER — Ambulatory Visit
Admission: RE | Admit: 2022-01-04 | Discharge: 2022-01-04 | Disposition: A | Discharge status: Home / Self Care | Payer: No Typology Code available for payment source | Source: Ambulatory Visit | Attending: Cardiology | Admitting: Cardiology

## 2022-01-04 DIAGNOSIS — R0789 Other chest pain: Secondary | ICD-10-CM

## 2022-01-04 DIAGNOSIS — I700 Atherosclerosis of aorta: Secondary | ICD-10-CM | POA: Diagnosis not present

## 2022-01-21 ENCOUNTER — Ambulatory Visit: Payer: PPO | Admitting: Cardiology

## 2022-01-21 ENCOUNTER — Encounter: Payer: Self-pay | Admitting: Cardiology

## 2022-01-21 VITALS — BP 135/60 | HR 55 | Resp 13 | Ht 63.0 in | Wt 213.6 lb

## 2022-01-21 DIAGNOSIS — R0789 Other chest pain: Secondary | ICD-10-CM

## 2022-01-21 DIAGNOSIS — I10 Essential (primary) hypertension: Secondary | ICD-10-CM

## 2022-01-21 MED ORDER — HYDRALAZINE HCL 50 MG PO TABS: 50.0000 mg | ORAL_TABLET | Freq: Two times a day (BID) | ORAL | 3 refills | Status: DC

## 2022-01-21 NOTE — Progress Notes (Signed)
Patient referred by Harlan Stains, MD for hypertension, chest heaviness  Subjective:   Elizabeth [name], female    DOB: 02/12/1951, 71 y.o.   MRN: [id number]   Chief Complaint  Patient presents with   Hypertension   Follow-up    3 month    HPI  71 y.o. African-American female with hypertension, undifferentiated connective tissue disease  Chest pain improved. Blood pressure controlled, but significant wight gain since starting amlodipine. She would like to come off amlodipine. Reviewed recent test results with the patient, details below.    Initial consultation visit 08/2021: Patient is retired from [place] [employer]. She stays active with regular exercise-including aerobic exercise and weight training. She had been treated with plaquenil for undifferentiated connective tissue disease. She eventually came off this medication without any recurrence of joint pain. She had come off antihypertensive medications with weight loss. However, in the last few months, she has had significant increase in blood pressure. Concurrently, she had had episode of chest heaviness lasting for several min. She does not have any chest pain during her physical exertion.   Patient previously underwent echocardiogram and stress testing in 2013, that showed structurally normal heart and anterior and inferior artifact due to obesity without evidence of ischemia.    Current Outpatient Medications:    amLODipine (NORVASC) 5 MG tablet, Take 1 tablet (5 mg total) by mouth daily., Disp: 90 tablet, Rfl: 3   telmisartan-hydrochlorothiazide (MICARDIS HCT) 80-25 MG tablet, Take 1 tablet by mouth daily., Disp: 90 tablet, Rfl: 3   Cardiovascular and other pertinent studies:  Reviewed external labs and tests, independently interpreted  Exercise nuclear stress test 10/25/2021: Normal myocardial perfusion. Some soft tissue attenuation noted. All segments of left ventricle demonstrated normal wall motion and thickening.  Stress LV EF is normal 60%. Normal ECG stress. The heart rate response was normal. The blood pressure response was normal. No previous exam available for comparison.  Renal artery duplex  10/07/2021:  No evidence of renal artery occlusive disease in either renal artery.  Normal resistivity index.  Renal length is within normal limits for both kidneys.  Normal abdominal aorta flow velocities noted.   Echocardiogram 10/07/2021:  Normal LV systolic function with visual EF 60-65%. Left ventricle cavity  is normal in size. Normal left ventricular wall thickness. Normal global  wall motion. Normal diastolic filling pattern, normal LAP.  Mild (Grade I) mitral regurgitation. Mild calcification of the mitral  valve annulus.  No prior study for comparison.   EKG 09/10/2021: Sinus bradycardia 50 bpm  Possible old anteroseptal infarct   Recent labs: 09/16/2021: Glucose 98, BUN/Cr 14/0.84. EGFR 75. Na/K 142/4.7.  Aldosterone 5 normal Renin 16 high Renin/aldosterone ratio 0.3 normal  08/20/2021: Glucose 90, BUN/Cr 12/0.78. EGFR 82. Na/K 142/4.5. Rest of the CMP normal H/H 12/37. MCV 82. Platelets 177 TSH 1.7 normal    Review of Systems  Cardiovascular:  Negative for chest pain, dyspnea on exertion, leg swelling, palpitations and syncope.         Vitals:   01/21/22 0853  BP: 135/60  Pulse: (!) 55  Resp: 13  SpO2: 100%     Body mass index is 37.84 kg/m. Filed Weights   01/21/22 0853  Weight: 213 lb 9.6 oz (96.9 kg)    Objective:   Physical Exam Vitals and nursing note reviewed.  Constitutional:      General: She is not in acute distress. Neck:     Vascular: No JVD.  Cardiovascular:  Rate and Rhythm: Normal rate and regular rhythm.     Heart sounds: Normal heart sounds. No murmur heard. Pulmonary:     Effort: Pulmonary effort is normal.     Breath sounds: Normal breath sounds. No wheezing or rales.  Musculoskeletal:     Right lower leg: No edema.     Left  lower leg: No edema.         Visit diagnoses:   ICD-10-CM   1. Essential hypertension  I10 hydrALAZINE (APRESOLINE) 50 MG tablet    2. Chest heaviness  R07.89           Meds ordered this encounter  Medications   hydrALAZINE (APRESOLINE) 50 MG tablet    Sig: Take 1 tablet (50 mg total) by mouth in the morning and at bedtime.    Dispense:  120 tablet    Refill:  3     Assessment & Recommendations:    71 y.o. African-American female with hypertension, undifferentiated connective tissue disease, referred for evaluation of chest pain  Chest heaviness: Not exertional.  This has persisted in spite of control of blood pressure.  No ischemia on stress testing (10/2021)/  Hypertension: High renin but no RAS on renal artery duplex. Continue telmisartan-HCTZ 80-25 mg. She would like to come off amlodipine 5 mg daily due to weight gain. Instead, started hydralazine 50 mg bid.   F/u in 2 months    Nigel Mormon, MD Pager: [phone] Office: [phone]

## 2022-01-24 DIAGNOSIS — Z6836 Body mass index (BMI) 36.0-36.9, adult: Secondary | ICD-10-CM | POA: Diagnosis not present

## 2022-01-24 DIAGNOSIS — E669 Obesity, unspecified: Secondary | ICD-10-CM | POA: Diagnosis not present

## 2022-01-24 DIAGNOSIS — M359 Systemic involvement of connective tissue, unspecified: Secondary | ICD-10-CM | POA: Diagnosis not present

## 2022-03-11 DIAGNOSIS — U071 COVID-19: Secondary | ICD-10-CM | POA: Diagnosis not present

## 2022-03-28 ENCOUNTER — Ambulatory Visit: Payer: PPO | Admitting: Cardiology

## 2022-03-28 ENCOUNTER — Encounter: Payer: Self-pay | Admitting: Cardiology

## 2022-03-28 VITALS — BP 150/74 | HR 59 | Resp 16 | Ht 63.0 in | Wt 214.0 lb

## 2022-03-28 DIAGNOSIS — I10 Essential (primary) hypertension: Secondary | ICD-10-CM

## 2022-03-28 MED ORDER — HYDRALAZINE HCL 50 MG PO TABS: 25.0000 mg | ORAL_TABLET | Freq: Two times a day (BID) | ORAL | 0 refills | Status: AC

## 2022-03-28 NOTE — Progress Notes (Addendum)
Patient referred by Laurann [place], MD for hypertension, chest heaviness  Subjective:   Elizabeth [name], female    DOB: April 03, 1950, 72 y.o.   MRN: [id number]   Chief Complaint  Patient presents with   Hypertension   Follow-up    2 month    HPI  72 y.o. African-American female with hypertension, undifferentiated connective tissue disease  Blood pressure well-controlled at home.  Elevated in the office.  She would like to come off hydralazine if possible.  Initial consultation visit 08/2021: Patient is retired from [place] [employer]. She stays active with regular exercise-including aerobic exercise and weight training. She had been treated with plaquenil for undifferentiated connective tissue disease. She eventually came off this medication without any recurrence of joint pain. She had come off antihypertensive medications with weight loss. However, in the last few months, she has had significant increase in blood pressure. Concurrently, she had had episode of chest heaviness lasting for several min. She does not have any chest pain during her physical exertion.   Patient previously underwent echocardiogram and stress testing in 2013, that showed structurally normal heart and anterior and inferior artifact due to obesity without evidence of ischemia.    Current Outpatient Medications:    hydrALAZINE (APRESOLINE) 50 MG tablet, Take 1 tablet (50 mg total) by mouth in the morning and at bedtime., Disp: 120 tablet, Rfl: 3   PLAQUENIL 200 MG tablet, Take 200 mg by mouth daily., Disp: , Rfl:    telmisartan-hydrochlorothiazide (MICARDIS HCT) 80-25 MG tablet, Take 1 tablet by mouth daily., Disp: 90 tablet, Rfl: 3   Cardiovascular and other pertinent studies:  Reviewed external labs and tests, independently interpreted  EKG 03/28/2022: Sinus rhythm 59 bpm Possible old anteroseptal infarct  Exercise nuclear stress test 10/25/2021: Normal myocardial perfusion. Some soft tissue attenuation  noted. All segments of left ventricle demonstrated normal wall motion and thickening. Stress LV EF is normal 60%. Normal ECG stress. The heart rate response was normal. The blood pressure response was normal. No previous exam available for comparison.  Renal artery duplex  10/07/2021:  No evidence of renal artery occlusive disease in either renal artery.  Normal resistivity index.  Renal length is within normal limits for both kidneys.  Normal abdominal aorta flow velocities noted.   Echocardiogram 10/07/2021:  Normal LV systolic function with visual EF 60-65%. Left ventricle cavity  is normal in size. Normal left ventricular wall thickness. Normal global  wall motion. Normal diastolic filling pattern, normal LAP.  Mild (Grade I) mitral regurgitation. Mild calcification of the mitral  valve annulus.  No prior study for comparison.   Recent labs: 09/16/2021: Glucose 98, BUN/Cr 14/0.84. EGFR 75. Na/K 142/4.7.  Aldosterone 5 normal Renin 16 high Renin/aldosterone ratio 0.3 normal  08/20/2021: Glucose 90, BUN/Cr 12/0.78. EGFR 82. Na/K 142/4.5. Rest of the CMP normal H/H 12/37. MCV 82. Platelets 177 TSH 1.7 normal    Review of Systems  Cardiovascular:  Negative for chest pain, dyspnea on exertion, leg swelling, palpitations and syncope.         Vitals:   03/28/22 0828  BP: (!) 150/74  Pulse: (!) 59  Resp: 16  SpO2: 100%     Body mass index is 37.91 kg/m. Filed Weights   03/28/22 0828  Weight: 214 lb (97.1 kg)    Objective:   Physical Exam Vitals and nursing note reviewed.  Constitutional:      General: She is not in acute distress. Neck:     Vascular:  No JVD.  Cardiovascular:     Rate and Rhythm: Normal rate and regular rhythm.     Heart sounds: Normal heart sounds. No murmur heard. Pulmonary:     Effort: Pulmonary effort is normal.     Breath sounds: Normal breath sounds. No wheezing or rales.  Musculoskeletal:     Right lower leg: No edema.     Left  lower leg: No edema.         Visit diagnoses:   ICD-10-CM   1. Essential hypertension  I10 EKG 12-Lead    hydrALAZINE (APRESOLINE) 50 MG tablet         Assessment & Recommendations:    72 y.o. African-American female with hypertension, undifferentiated connective tissue disease, referred for evaluation of chest pain  Chest heaviness: Not exertional.  This has persisted in spite of control of blood pressure.  No ischemia on stress testing (10/2021). Resolved.  Hypertension: High renin but no RAS on renal artery duplex. Continue telmisartan-HCTZ 80-25 mg. She would like to come off hydralazine, if possible. Reduce to 25 mg bid. GO back on amlodipine, if SBP remains >140 mmHg.   F/u in 3 months    Nigel Mormon, MD Pager: [phone] Office: [phone]

## 2022-04-06 DIAGNOSIS — H524 Presbyopia: Secondary | ICD-10-CM | POA: Diagnosis not present

## 2022-04-06 DIAGNOSIS — H5203 Hypermetropia, bilateral: Secondary | ICD-10-CM | POA: Diagnosis not present

## 2022-04-06 DIAGNOSIS — Z79899 Other long term (current) drug therapy: Secondary | ICD-10-CM | POA: Diagnosis not present

## 2022-04-06 DIAGNOSIS — H2513 Age-related nuclear cataract, bilateral: Secondary | ICD-10-CM | POA: Diagnosis not present

## 2022-05-09 DIAGNOSIS — M359 Systemic involvement of connective tissue, unspecified: Secondary | ICD-10-CM | POA: Diagnosis not present

## 2022-05-09 DIAGNOSIS — I10 Essential (primary) hypertension: Secondary | ICD-10-CM | POA: Diagnosis not present

## 2022-05-27 ENCOUNTER — Ambulatory Visit: Payer: PPO | Admitting: Cardiology

## 2022-08-08 DIAGNOSIS — D849 Immunodeficiency, unspecified: Secondary | ICD-10-CM | POA: Diagnosis not present

## 2022-08-08 DIAGNOSIS — J029 Acute pharyngitis, unspecified: Secondary | ICD-10-CM | POA: Diagnosis not present

## 2022-08-08 DIAGNOSIS — I10 Essential (primary) hypertension: Secondary | ICD-10-CM | POA: Diagnosis not present

## 2022-08-08 DIAGNOSIS — M359 Systemic involvement of connective tissue, unspecified: Secondary | ICD-10-CM | POA: Diagnosis not present

## 2022-09-28 DIAGNOSIS — E669 Obesity, unspecified: Secondary | ICD-10-CM | POA: Diagnosis not present

## 2022-09-28 DIAGNOSIS — Z6838 Body mass index (BMI) 38.0-38.9, adult: Secondary | ICD-10-CM | POA: Diagnosis not present

## 2022-09-28 DIAGNOSIS — M359 Systemic involvement of connective tissue, unspecified: Secondary | ICD-10-CM | POA: Diagnosis not present

## 2022-09-28 DIAGNOSIS — R808 Other proteinuria: Secondary | ICD-10-CM | POA: Diagnosis not present

## 2022-10-10 DIAGNOSIS — Z1231 Encounter for screening mammogram for malignant neoplasm of breast: Secondary | ICD-10-CM | POA: Diagnosis not present

## 2023-01-05 DIAGNOSIS — I10 Essential (primary) hypertension: Secondary | ICD-10-CM | POA: Diagnosis not present

## 2023-01-05 DIAGNOSIS — M359 Systemic involvement of connective tissue, unspecified: Secondary | ICD-10-CM | POA: Diagnosis not present

## 2023-01-05 DIAGNOSIS — D849 Immunodeficiency, unspecified: Secondary | ICD-10-CM | POA: Diagnosis not present

## 2023-01-05 DIAGNOSIS — G479 Sleep disorder, unspecified: Secondary | ICD-10-CM | POA: Diagnosis not present

## 2023-01-05 DIAGNOSIS — Z6841 Body Mass Index (BMI) 40.0 and over, adult: Secondary | ICD-10-CM | POA: Diagnosis not present

## 2023-01-09 DIAGNOSIS — Z6839 Body mass index (BMI) 39.0-39.9, adult: Secondary | ICD-10-CM | POA: Diagnosis not present

## 2023-01-09 DIAGNOSIS — M1991 Primary osteoarthritis, unspecified site: Secondary | ICD-10-CM | POA: Diagnosis not present

## 2023-01-09 DIAGNOSIS — R808 Other proteinuria: Secondary | ICD-10-CM | POA: Diagnosis not present

## 2023-01-09 DIAGNOSIS — M359 Systemic involvement of connective tissue, unspecified: Secondary | ICD-10-CM | POA: Diagnosis not present

## 2023-01-09 DIAGNOSIS — E669 Obesity, unspecified: Secondary | ICD-10-CM | POA: Diagnosis not present

## 2023-02-20 DIAGNOSIS — Z90710 Acquired absence of both cervix and uterus: Secondary | ICD-10-CM | POA: Diagnosis not present

## 2023-02-20 DIAGNOSIS — D849 Immunodeficiency, unspecified: Secondary | ICD-10-CM | POA: Diagnosis not present

## 2023-02-20 DIAGNOSIS — R61 Generalized hyperhidrosis: Secondary | ICD-10-CM | POA: Diagnosis not present

## 2023-02-20 DIAGNOSIS — F5101 Primary insomnia: Secondary | ICD-10-CM | POA: Diagnosis not present

## 2023-02-20 DIAGNOSIS — Z111 Encounter for screening for respiratory tuberculosis: Secondary | ICD-10-CM | POA: Diagnosis not present

## 2023-02-20 DIAGNOSIS — J479 Bronchiectasis, uncomplicated: Secondary | ICD-10-CM | POA: Diagnosis not present

## 2023-02-20 DIAGNOSIS — G4733 Obstructive sleep apnea (adult) (pediatric): Secondary | ICD-10-CM | POA: Diagnosis not present

## 2023-02-20 DIAGNOSIS — F325 Major depressive disorder, single episode, in full remission: Secondary | ICD-10-CM | POA: Diagnosis not present

## 2023-02-20 DIAGNOSIS — I10 Essential (primary) hypertension: Secondary | ICD-10-CM | POA: Diagnosis not present

## 2023-02-20 DIAGNOSIS — Z6841 Body Mass Index (BMI) 40.0 and over, adult: Secondary | ICD-10-CM | POA: Diagnosis not present

## 2023-02-20 DIAGNOSIS — I700 Atherosclerosis of aorta: Secondary | ICD-10-CM | POA: Diagnosis not present

## 2023-03-07 ENCOUNTER — Other Ambulatory Visit: Payer: Self-pay | Admitting: Family Medicine

## 2023-03-07 DIAGNOSIS — R61 Generalized hyperhidrosis: Secondary | ICD-10-CM

## 2023-03-08 DIAGNOSIS — Z1212 Encounter for screening for malignant neoplasm of rectum: Secondary | ICD-10-CM | POA: Diagnosis not present

## 2023-03-24 ENCOUNTER — Ambulatory Visit
Admission: RE | Admit: 2023-03-24 | Discharge: 2023-03-24 | Disposition: A | Discharge status: Home / Self Care | Payer: PPO | Source: Ambulatory Visit | Attending: Family Medicine | Admitting: Family Medicine

## 2023-03-24 ENCOUNTER — Other Ambulatory Visit: Payer: Self-pay | Admitting: Family Medicine

## 2023-03-24 DIAGNOSIS — R61 Generalized hyperhidrosis: Secondary | ICD-10-CM

## 2023-03-24 MED ORDER — IOPAMIDOL (ISOVUE-300) INJECTION 61%
100.0000 mL | Freq: Once | INTRAVENOUS | Status: AC | PRN
  Administered 2023-03-24: 100 mL via INTRAVENOUS

## 2023-04-20 DIAGNOSIS — L680 Hirsutism: Secondary | ICD-10-CM | POA: Diagnosis not present

## 2023-04-20 DIAGNOSIS — D3502 Benign neoplasm of left adrenal gland: Secondary | ICD-10-CM | POA: Diagnosis not present

## 2023-06-20 DIAGNOSIS — J40 Bronchitis, not specified as acute or chronic: Secondary | ICD-10-CM | POA: Diagnosis not present

## 2023-06-20 DIAGNOSIS — D849 Immunodeficiency, unspecified: Secondary | ICD-10-CM | POA: Diagnosis not present

## 2023-06-20 DIAGNOSIS — J069 Acute upper respiratory infection, unspecified: Secondary | ICD-10-CM | POA: Diagnosis not present

## 2023-06-20 DIAGNOSIS — I10 Essential (primary) hypertension: Secondary | ICD-10-CM | POA: Diagnosis not present

## 2023-06-20 DIAGNOSIS — J301 Allergic rhinitis due to pollen: Secondary | ICD-10-CM | POA: Diagnosis not present

## 2023-07-11 DIAGNOSIS — R808 Other proteinuria: Secondary | ICD-10-CM | POA: Diagnosis not present

## 2023-07-11 DIAGNOSIS — M1991 Primary osteoarthritis, unspecified site: Secondary | ICD-10-CM | POA: Diagnosis not present

## 2023-07-11 DIAGNOSIS — Z6841 Body Mass Index (BMI) 40.0 and over, adult: Secondary | ICD-10-CM | POA: Diagnosis not present

## 2023-07-11 DIAGNOSIS — M359 Systemic involvement of connective tissue, unspecified: Secondary | ICD-10-CM | POA: Diagnosis not present

## 2023-08-18 DIAGNOSIS — I10 Essential (primary) hypertension: Secondary | ICD-10-CM | POA: Diagnosis not present

## 2023-08-18 DIAGNOSIS — J479 Bronchiectasis, uncomplicated: Secondary | ICD-10-CM | POA: Diagnosis not present

## 2023-08-19 DIAGNOSIS — F325 Major depressive disorder, single episode, in full remission: Secondary | ICD-10-CM | POA: Diagnosis not present

## 2023-08-19 DIAGNOSIS — M810 Age-related osteoporosis without current pathological fracture: Secondary | ICD-10-CM | POA: Diagnosis not present

## 2023-08-19 DIAGNOSIS — J479 Bronchiectasis, uncomplicated: Secondary | ICD-10-CM | POA: Diagnosis not present

## 2023-09-16 DIAGNOSIS — J479 Bronchiectasis, uncomplicated: Secondary | ICD-10-CM | POA: Diagnosis not present

## 2023-09-16 DIAGNOSIS — I10 Essential (primary) hypertension: Secondary | ICD-10-CM | POA: Diagnosis not present

## 2023-09-18 DIAGNOSIS — F325 Major depressive disorder, single episode, in full remission: Secondary | ICD-10-CM | POA: Diagnosis not present

## 2023-09-18 DIAGNOSIS — J479 Bronchiectasis, uncomplicated: Secondary | ICD-10-CM | POA: Diagnosis not present

## 2023-09-18 DIAGNOSIS — I10 Essential (primary) hypertension: Secondary | ICD-10-CM | POA: Diagnosis not present

## 2023-09-18 DIAGNOSIS — M810 Age-related osteoporosis without current pathological fracture: Secondary | ICD-10-CM | POA: Diagnosis not present

## 2023-10-16 DIAGNOSIS — J479 Bronchiectasis, uncomplicated: Secondary | ICD-10-CM | POA: Diagnosis not present

## 2023-10-16 DIAGNOSIS — I10 Essential (primary) hypertension: Secondary | ICD-10-CM | POA: Diagnosis not present

## 2023-10-16 DIAGNOSIS — R29890 Loss of height: Secondary | ICD-10-CM | POA: Diagnosis not present

## 2023-10-16 DIAGNOSIS — N958 Other specified menopausal and perimenopausal disorders: Secondary | ICD-10-CM | POA: Diagnosis not present

## 2023-10-16 DIAGNOSIS — M8588 Other specified disorders of bone density and structure, other site: Secondary | ICD-10-CM | POA: Diagnosis not present

## 2023-10-16 DIAGNOSIS — Z1231 Encounter for screening mammogram for malignant neoplasm of breast: Secondary | ICD-10-CM | POA: Diagnosis not present

## 2023-10-19 DIAGNOSIS — I10 Essential (primary) hypertension: Secondary | ICD-10-CM | POA: Diagnosis not present

## 2023-10-19 DIAGNOSIS — J479 Bronchiectasis, uncomplicated: Secondary | ICD-10-CM | POA: Diagnosis not present

## 2023-10-23 DIAGNOSIS — M810 Age-related osteoporosis without current pathological fracture: Secondary | ICD-10-CM | POA: Diagnosis not present

## 2023-10-23 DIAGNOSIS — I10 Essential (primary) hypertension: Secondary | ICD-10-CM | POA: Diagnosis not present

## 2023-11-07 DIAGNOSIS — M109 Gout, unspecified: Secondary | ICD-10-CM | POA: Diagnosis not present

## 2023-11-07 DIAGNOSIS — H109 Unspecified conjunctivitis: Secondary | ICD-10-CM | POA: Diagnosis not present

## 2023-11-13 DIAGNOSIS — M109 Gout, unspecified: Secondary | ICD-10-CM | POA: Diagnosis not present

## 2023-11-13 DIAGNOSIS — H109 Unspecified conjunctivitis: Secondary | ICD-10-CM | POA: Diagnosis not present

## 2023-11-19 DIAGNOSIS — I10 Essential (primary) hypertension: Secondary | ICD-10-CM | POA: Diagnosis not present

## 2023-11-19 DIAGNOSIS — F325 Major depressive disorder, single episode, in full remission: Secondary | ICD-10-CM | POA: Diagnosis not present

## 2023-11-19 DIAGNOSIS — J479 Bronchiectasis, uncomplicated: Secondary | ICD-10-CM | POA: Diagnosis not present

## 2023-11-19 DIAGNOSIS — M810 Age-related osteoporosis without current pathological fracture: Secondary | ICD-10-CM | POA: Diagnosis not present

## 2023-11-22 DIAGNOSIS — Z23 Encounter for immunization: Secondary | ICD-10-CM | POA: Diagnosis not present

## 2023-11-22 DIAGNOSIS — R21 Rash and other nonspecific skin eruption: Secondary | ICD-10-CM | POA: Diagnosis not present

## 2023-11-22 DIAGNOSIS — M109 Gout, unspecified: Secondary | ICD-10-CM | POA: Diagnosis not present

## 2023-11-22 DIAGNOSIS — I10 Essential (primary) hypertension: Secondary | ICD-10-CM | POA: Diagnosis not present

## 2023-12-13 DIAGNOSIS — D649 Anemia, unspecified: Secondary | ICD-10-CM | POA: Diagnosis not present

## 2023-12-13 DIAGNOSIS — R609 Edema, unspecified: Secondary | ICD-10-CM | POA: Diagnosis not present

## 2023-12-13 DIAGNOSIS — R052 Subacute cough: Secondary | ICD-10-CM | POA: Diagnosis not present

## 2023-12-13 DIAGNOSIS — I10 Essential (primary) hypertension: Secondary | ICD-10-CM | POA: Diagnosis not present

## 2023-12-13 DIAGNOSIS — M109 Gout, unspecified: Secondary | ICD-10-CM | POA: Diagnosis not present

## 2023-12-13 DIAGNOSIS — M7989 Other specified soft tissue disorders: Secondary | ICD-10-CM | POA: Diagnosis not present

## 2023-12-27 DIAGNOSIS — E611 Iron deficiency: Secondary | ICD-10-CM | POA: Diagnosis not present

## 2023-12-27 DIAGNOSIS — R609 Edema, unspecified: Secondary | ICD-10-CM | POA: Diagnosis not present

## 2023-12-27 DIAGNOSIS — I10 Essential (primary) hypertension: Secondary | ICD-10-CM | POA: Diagnosis not present

## 2023-12-27 DIAGNOSIS — M109 Gout, unspecified: Secondary | ICD-10-CM | POA: Diagnosis not present

## 2024-01-10 DIAGNOSIS — E611 Iron deficiency: Secondary | ICD-10-CM | POA: Diagnosis not present

## 2024-01-16 DIAGNOSIS — K760 Fatty (change of) liver, not elsewhere classified: Secondary | ICD-10-CM | POA: Diagnosis not present

## 2024-01-16 DIAGNOSIS — R195 Other fecal abnormalities: Secondary | ICD-10-CM | POA: Diagnosis not present

## 2024-01-16 DIAGNOSIS — Z1211 Encounter for screening for malignant neoplasm of colon: Secondary | ICD-10-CM | POA: Diagnosis not present

## 2024-01-16 DIAGNOSIS — D509 Iron deficiency anemia, unspecified: Secondary | ICD-10-CM | POA: Diagnosis not present

## 2024-01-17 DIAGNOSIS — M1A09X0 Idiopathic chronic gout, multiple sites, without tophus (tophi): Secondary | ICD-10-CM | POA: Diagnosis not present

## 2024-01-17 DIAGNOSIS — M1991 Primary osteoarthritis, unspecified site: Secondary | ICD-10-CM | POA: Diagnosis not present

## 2024-01-17 DIAGNOSIS — R808 Other proteinuria: Secondary | ICD-10-CM | POA: Diagnosis not present

## 2024-01-17 DIAGNOSIS — M8580 Other specified disorders of bone density and structure, unspecified site: Secondary | ICD-10-CM | POA: Diagnosis not present

## 2024-01-17 DIAGNOSIS — Z6841 Body Mass Index (BMI) 40.0 and over, adult: Secondary | ICD-10-CM | POA: Diagnosis not present

## 2024-01-17 DIAGNOSIS — M359 Systemic involvement of connective tissue, unspecified: Secondary | ICD-10-CM | POA: Diagnosis not present

## 2024-01-24 DIAGNOSIS — Z1211 Encounter for screening for malignant neoplasm of colon: Secondary | ICD-10-CM | POA: Diagnosis not present

## 2024-01-24 DIAGNOSIS — D509 Iron deficiency anemia, unspecified: Secondary | ICD-10-CM | POA: Diagnosis not present

## 2024-01-24 DIAGNOSIS — R195 Other fecal abnormalities: Secondary | ICD-10-CM | POA: Diagnosis not present

## 2024-01-24 DIAGNOSIS — K2100 Gastro-esophageal reflux disease with esophagitis, without bleeding: Secondary | ICD-10-CM | POA: Diagnosis not present

## 2024-01-24 DIAGNOSIS — K5730 Diverticulosis of large intestine without perforation or abscess without bleeding: Secondary | ICD-10-CM | POA: Diagnosis not present

## 2024-01-24 DIAGNOSIS — K3189 Other diseases of stomach and duodenum: Secondary | ICD-10-CM | POA: Diagnosis not present

## 2024-01-24 DIAGNOSIS — K635 Polyp of colon: Secondary | ICD-10-CM | POA: Diagnosis not present

## 2024-02-08 DIAGNOSIS — K5730 Diverticulosis of large intestine without perforation or abscess without bleeding: Secondary | ICD-10-CM | POA: Diagnosis not present

## 2024-02-08 DIAGNOSIS — K760 Fatty (change of) liver, not elsewhere classified: Secondary | ICD-10-CM | POA: Diagnosis not present

## 2024-02-08 DIAGNOSIS — D509 Iron deficiency anemia, unspecified: Secondary | ICD-10-CM | POA: Diagnosis not present

## 2024-02-08 DIAGNOSIS — K2100 Gastro-esophageal reflux disease with esophagitis, without bleeding: Secondary | ICD-10-CM | POA: Diagnosis not present

## 2024-02-19 ENCOUNTER — Ambulatory Visit (HOSPITAL_COMMUNITY)
Admission: RE | Admit: 2024-02-19 | Discharge: 2024-02-19 | Disposition: A | Discharge status: Home / Self Care | Attending: Gastroenterology | Admitting: Gastroenterology

## 2024-02-19 ENCOUNTER — Encounter (HOSPITAL_COMMUNITY)
Admission: RE | Disposition: A | Discharge status: Home / Self Care | Payer: Self-pay | Source: Home / Self Care | Attending: Gastroenterology

## 2024-02-19 DIAGNOSIS — D509 Iron deficiency anemia, unspecified: Secondary | ICD-10-CM | POA: Insufficient documentation

## 2024-02-19 HISTORY — PX: GIVENS CAPSULE STUDY: SHX5432

## 2024-02-19 SURGERY — IMAGING PROCEDURE, GI TRACT, INTRALUMINAL, VIA CAPSULE

## 2024-02-19 SURGICAL SUPPLY — 1 items: TOWEL COTTON PACK 4EA (MISCELLANEOUS) ×2 IMPLANT

## 2024-02-20 ENCOUNTER — Encounter (HOSPITAL_COMMUNITY): Payer: Self-pay | Admitting: Gastroenterology

## 2024-02-26 DIAGNOSIS — D509 Iron deficiency anemia, unspecified: Secondary | ICD-10-CM | POA: Diagnosis not present
